# Patient Record
Sex: Female | Born: 1955 | Race: White | Hispanic: No | Marital: Married | Smoking: Never smoker
Health system: Southern US, Community
[De-identification: ages and names within clinical notes are randomized; demographics above are authoritative.]

## PROBLEM LIST (undated history)

## (undated) DIAGNOSIS — I10 Essential (primary) hypertension: Secondary | ICD-10-CM

## (undated) DIAGNOSIS — R112 Nausea with vomiting, unspecified: Secondary | ICD-10-CM

## (undated) DIAGNOSIS — M1990 Unspecified osteoarthritis, unspecified site: Secondary | ICD-10-CM

## (undated) DIAGNOSIS — K219 Gastro-esophageal reflux disease without esophagitis: Secondary | ICD-10-CM

## (undated) DIAGNOSIS — R51 Headache: Secondary | ICD-10-CM

## (undated) DIAGNOSIS — F419 Anxiety disorder, unspecified: Secondary | ICD-10-CM

## (undated) DIAGNOSIS — M797 Fibromyalgia: Secondary | ICD-10-CM

## (undated) DIAGNOSIS — I341 Nonrheumatic mitral (valve) prolapse: Secondary | ICD-10-CM

## (undated) DIAGNOSIS — T7840XA Allergy, unspecified, initial encounter: Secondary | ICD-10-CM

## (undated) DIAGNOSIS — S43439A Superior glenoid labrum lesion of unspecified shoulder, initial encounter: Secondary | ICD-10-CM

## (undated) DIAGNOSIS — Z9889 Other specified postprocedural states: Secondary | ICD-10-CM

## (undated) HISTORY — DX: Other specified postprocedural states: R11.2

## (undated) HISTORY — DX: Allergy, unspecified, initial encounter: T78.40XA

## (undated) HISTORY — PX: ABDOMINAL HYSTERECTOMY: SHX81

## (undated) HISTORY — DX: Superior glenoid labrum lesion of unspecified shoulder, initial encounter: S43.439A

## (undated) HISTORY — PX: BREAST SURGERY: SHX581

## (undated) HISTORY — PX: JOINT REPLACEMENT: SHX530

## (undated) HISTORY — DX: Nonrheumatic mitral (valve) prolapse: I34.1

## (undated) HISTORY — DX: Headache: R51

## (undated) HISTORY — PX: HIP ARTHROSCOPY: SUR88

## (undated) HISTORY — DX: Fibromyalgia: M79.7

## (undated) HISTORY — DX: Other specified postprocedural states: Z98.890

## (undated) HISTORY — DX: Essential (primary) hypertension: I10

## (undated) HISTORY — PX: VAGINAL HYSTERECTOMY: SUR661

## (undated) HISTORY — DX: Anxiety disorder, unspecified: F41.9

## (undated) HISTORY — PX: TUBAL LIGATION: SHX77

## (undated) HISTORY — DX: Gastro-esophageal reflux disease without esophagitis: K21.9

---

## 1997-10-07 ENCOUNTER — Ambulatory Visit (HOSPITAL_COMMUNITY): Admission: RE | Admit: 1997-10-07 | Discharge: 1997-10-07 | Payer: Self-pay | Admitting: *Deleted

## 1998-10-05 ENCOUNTER — Ambulatory Visit (HOSPITAL_COMMUNITY): Admission: RE | Admit: 1998-10-05 | Discharge: 1998-10-05 | Payer: Self-pay | Admitting: Gastroenterology

## 1999-03-10 ENCOUNTER — Ambulatory Visit (HOSPITAL_COMMUNITY): Admission: RE | Admit: 1999-03-10 | Discharge: 1999-03-10 | Payer: Self-pay | Admitting: *Deleted

## 1999-10-18 ENCOUNTER — Other Ambulatory Visit: Admission: RE | Admit: 1999-10-18 | Discharge: 1999-10-18 | Payer: Self-pay | Admitting: *Deleted

## 2000-09-08 ENCOUNTER — Ambulatory Visit (HOSPITAL_COMMUNITY): Admission: RE | Admit: 2000-09-08 | Discharge: 2000-09-08 | Payer: Self-pay | Admitting: Internal Medicine

## 2000-09-08 ENCOUNTER — Encounter: Payer: Self-pay | Admitting: Internal Medicine

## 2001-09-06 NOTE — Progress Notes (Signed)
CORPORATE CARE CLINICAL SUMMARY   Karen Fernandez, Karen Fernandez                     SS#:        [id number]   DOB:    29-Sep-1955                               AGE:        45   SEX:    F                                     LOCATION:  CORPOR[facility] CARE   MR#:    [id number]                                 DATE:       09/06/2001   DICTATING PHYSICIAN:  Angus Palms, D.O.   The patient is here for evaluation of back pain going on the last 3 months   on a daily basis just frustrating her since it started after moving a   house.  It is bilateral along the spine from the neck to her lower back and   it just feels tight a great deal of the time.  It can bother her sleep as   it can awaken her and her job has been made uncomfortable from it.  She has   no history of scoliosis or prior back problem.  There is no bladder or   bowel difficulty.   ALLERGIES:  Morphine.   MEDICATIONS:   1. Prevacid.   2. Paxil.   3. Estratest.   PHYSICAL EXAMINATION:   VITALS:  Blood pressure 116/78, temperature 97.2, pulse 80, respirations   20, 5'10", 154 pounds.  Last menstrual period hysterectomy in 1994.   Examination shows normal range of motion of the spine but it does produce   tightness on all different range of motions.  There is tightness and   soreness in the mid upper back paraspinal area and the lower lumbosacral   area.  Reflexes are normal.  Straight leg raise is normal.  No   radiculopathy.   ASSESSMENT:   1. Cervical strain.   2. Thoracic strain.   3. Lumbosacral strain status post 3 months.   PLAN:  Continue with rest.  Can stop antiinflammatory since it is not   helping and we will try Skelaxin muscle relaxer 1 to 2 q.6h. p.r.n. in the   evenings and see response.  Will refer to physical therapy to help   recondition and increase her flexibility due to the 11 month old problem and   we will see response.   Electronically Signed By:   Angus Palms, D.O. 03/12/2002 11:16    _____________________________________________   Angus Palms, D.O.   sp  D: 09/06/2001  T: 09/07/2001  8:53 A    [id number]

## 2001-09-11 NOTE — Progress Notes (Signed)
[facility]                       PHYSICAL THERAPY INITIAL EVALUATION   PATIENT NAME:  Karen Fernandez, Karen Fernandez   MR#:  [id number]   DATE:  09/11/2001   REFERRING PHYSICIAN:  Angus Palms, D.O.   DATABASE: The patient is a 46 year old female referred to physical therapy   by Dr. Tresa Endo with a diagnosis of cervical, thoracic, lumbosacral strain.   Evaluate and treat 2-3 x a week for 4 weeks.  Goals: decreased pain,   increased flexibility.   SUBJECTIVE:   HISTORY OF PRESENT ILLNESS: The patient reports that the muscles in her   back feel really tight and she is unable to get them to relax. She states   that the muscles feel "Uncomfortable" from her neck all the way down to   below her hips. Recently she says she has been moving a lot and has been   lifting and carrying boxes and mattresses though she does not have any pain   immediately following this activity. She states that if she stands greater   than 5-10 minutes she has pain around her waist.  She also notes that right   above her bra line and her waist is where the pain is the worst and she   occasionally has to use her knuckle to help relax areas of her back.  She   has tried performing stretches as well as abdominal exercises and has used   heat and ice, but states that nothing seems to help relax her muscles.   Some mornings she feels okay when she wakes up and other times she feels   stiff, but she also notes that the  pain gets worse as the day goes on.   She has difficulty bending over to do activities such as brush her teeth or   wash her face and she also has occasional pain while sitting if she is   sitting in a chair that does not have good back support. She does not   complain of any difficulty with driving since her car has a good lumbar   support. She notes occasional sharp pain in her upper back around her   shoulder blades, but denies any numbness or tingling at this time. She was    given Skelaxin which she takes at night and it helps her sleep. 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   DATE OF ONSET:  Approximately 2 months ago.   PAIN SCALE RATING:  At present 3/10, at worst 7/10.   MEDICATIONS: Skelaxin, Estratest and Prevacid.   ALLERGIES:  Morphine.   PRECAUTIONS:  None.   PAST MEDICAL HISTORY: Hysterectomy in 1994, patient relates a history of   sciatica on the left and sacroiliac joint dysfunction.   X-RAYS/TESTS: None.   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 She is   involved in her children's sports and also enjoys walking.   PREVIOUS PHYSICAL THERAPY: None.   PATIENT'S GOALS: Patient wants to be able to relax her back muscles, know   what to do when the pain starts and how to  prevent it.   OBJECTIVE:   RANGE OF MOTION: Lumbar spine:  Forward bending is within functional limits   with the patient exhibiting "Muscle flickering" when returning to neutral.   Backward bending is less than 25% of normal with patient reporting pain.   Side bending is 75% of normal with patient reporting contralateral pulling,   right side bending worse than the left. Rotation is limited bilaterally   though left is worse than the right and patient reports increased pain with   right rotation.   Cervical spine:  Forward bending and backward bending are both within   functional limits though patient notes pulling bilaterally with forward   bending. Side bending right is 50% of normal with patient reporting pain on   the right, left side bending is within functional limits.  Rotation right   75% of normal with patient reporting achiness and tightness on the right,   left rotation is within functional limits.    MANUAL MUSCLE TESTS: Gross strength of the bilateral upper and lower   extremities is within normal limits though patient did report some aching   in her lower back with resisted hip flexion.   PALPATION: The patient exhibited palpable and visible increase in muscle   tone of the right upper trapezius region greater than the left though both   sides exhibit significant tone.  The patient also exhibits increased   tension along the right quadratus lumborum  region greater than the left.   Her iliac crests and PSIS's are symmetrical.   POSTURE: The patient exhibits a flattened spine throughout. She exhibits a   decreased cervical lordosis and a forward head and rounded shoulders   posture.   SENSATION:  Patient denies any numbness or tingling at this time.   SPECIAL TESTS: Straight leg raise test was negative for radicular symptoms   though patient did report pulling in her lower back at approximately 80   degrees of elevation. Cervical compression and distraction was negative.   No other special tests were performed at this time.   TODAY'S TREATMENT: After the evaluation patient was instructed in stretches   to be performed for a home program.   ASSESSMENT:   Patient presents with diffuse pain along her entire spine with decreased   range of motion, increased muscle tone, poor posture with likely back   weakness and difficulty performing prolonged activities such as standing or   sitting, difficulty with all bending activities and probable poor body   mechanics.   GOALS:  To be completed in 4 weeks.   1.    The patient will be independent with home exercise program to         facilitate mobility and strengthening of her back.   2.    The patient will report pain at worst to be a 3/10.   3.    The patient will be able to sleep through the night without         medication and without awakening due to pain.   4.    The patient will tolerate standing for 20 minutes at a time without          complaints of increasing lower back pain.   5.    The patient will be able to perform her activities of daily living         such as brushing her teeth and washing her face without complaints of  increasing back pain.   REHAB POTENTIAL: Good.   BARRIERS TO ACHIEVING GOALS: None.   PLAN:   The patient will be seen 2-3 times a week for 4 weeks for modalities p.r.n.   (heat, ultrasound, electrical stimulation), manual treatment p.r.n. and   therapeutic exercise for stretching and stabilization exercises.  The   patient will be seen by a licensed physical therapist or physical therapy   assistant.   Thank you very much for this referral.   Sincerely,   Johney Maine, M.S.P.T.   sl  D:  09/11/2001  T:  09/12/2001  9:07 A   [id number]   cc:

## 2008-06-26 ENCOUNTER — Ambulatory Visit (HOSPITAL_COMMUNITY): Admission: RE | Admit: 2008-06-26 | Discharge: 2008-06-26 | Payer: Self-pay | Admitting: Orthopedic Surgery

## 2010-03-05 ENCOUNTER — Encounter: Admission: RE | Admit: 2010-03-05 | Discharge: 2010-03-05 | Payer: Self-pay | Admitting: Gastroenterology

## 2010-09-20 LAB — URINALYSIS, ROUTINE W REFLEX MICROSCOPIC
Bilirubin Urine: NEGATIVE
Glucose, UA: NEGATIVE mg/dL
Hgb urine dipstick: NEGATIVE
Ketones, ur: NEGATIVE mg/dL
Nitrite: NEGATIVE
Protein, ur: NEGATIVE mg/dL
Specific Gravity, Urine: 1.002 — ABNORMAL LOW (ref 1.005–1.030)
Urobilinogen, UA: 0.2 mg/dL (ref 0.0–1.0)
pH: 7.5 (ref 5.0–8.0)

## 2010-09-20 LAB — BASIC METABOLIC PANEL
BUN: 13 mg/dL (ref 6–23)
CO2: 31 mEq/L (ref 19–32)
Calcium: 10 mg/dL (ref 8.4–10.5)
Chloride: 98 mEq/L (ref 96–112)
Creatinine, Ser: 0.92 mg/dL (ref 0.4–1.2)
GFR calc Af Amer: >60 mL/min (ref >60)
GFR calc non Af Amer: >60 mL/min (ref >60)
Glucose, Bld: 93 mg/dL (ref 70–99)
Potassium: 4.9 mEq/L (ref 3.5–5.1)
Sodium: 140 mEq/L (ref 135–145)

## 2010-09-20 LAB — CBC
HCT: 42.3 % (ref 36.0–46.0)
Hemoglobin: 14.1 g/dL (ref 12.0–15.0)
MCV: 95.6 fL (ref 78.0–100.0)
RDW: 12.9 % (ref 11.5–15.5)
WBC: 6.3 10*3/uL (ref 4.0–10.5)

## 2010-09-20 LAB — APTT: aPTT: 29 seconds (ref 24–37)

## 2010-10-19 NOTE — Op Note (Signed)
Brenda Short, Brenda Short            ACCOUNT NO.:  [id number]   MEDICAL RECORD NO.:  [id number]          PATIENT TYPE:  AMB   LOCATION:  DAY                          FACILITY:  [facility]   PHYSICIAN:  Ollen Gross, M.D.    DATE OF BIRTH:  July 22, 1955   DATE OF PROCEDURE:  06/26/2008  DATE OF DISCHARGE:                               OPERATIVE REPORT   PREOPERATIVE DIAGNOSIS:  Left hip labral tear.   POSTOPERATIVE DIAGNOSIS:  1. Left hip labral tear.  2. Chondral defect.   PROCEDURES:  Left hip arthroscopy with labral debridement and  chondroplasty.   SURGEON:  F. Aluisio, M.D.   ASSISTANT:  Avel Peace, P.A.C.   ANESTHESIA:  General.   ESTIMATED BLOOD LOSS:  Minimal.   DRAINS:  None.   COMPLICATIONS:  None.   CONDITION:  Stable to recovery   BRIEF CLINICAL NOTE:  [name] is a 55 year old female who had an on-  the-job injury to her hip.  She has had progressively worsening hip pain  and dysfunction.  An MRI showed labral tear.  She had intra-articular  injection which did relieve her pain temporarily and then it came back.  She presents now for hip arthroscopy and debridement.   PROCEDURE IN DETAIL:  After successful administration of general  anesthetic, the patient was placed in the right lateral decubitus  position with the left side up and her left lower extremity is draped  over the well-padded leg holder and foot placed in the well-padded  traction boot.  Under fluoro guidance, we applied traction across the  hip to gain adequate distraction.  Once distracted, the leg is locked in  that position.  The thigh is then prepped and draped in the usual  sterile fashion.  A spinal needle is used to localize the anterior and  posterior peritrochanteric portals.  The needles are both passed into  the joint.  I then placed a nitinol wire and it created the posterior  portal.  The camera and cannula passed into the joint.  Once confirmed  to be an intra-articular, inflow  was initiated and arthroscopic  visualization proceeds.   There is some hypertrophic synovium around the fovea.  The acetabulum  looks great throughout the central portion and posterior.  Anteriorly,  it looks fine except there seems to be chondral blistering present at  about the 9-10 o'clock position.  There is no evidence of a full  thickness defect.  The femoral head does show some chondromalacia with  one area of a full thickness defect at the anterosuperior position.  She  has a lot of synovitis anteriorly.  There is also a labral tear at the  anterior-inferior anterior superior portion of the labrum.  It is not  detached but there is surface fraying and tear.  We then created the  anterior portal and placed a shaver to shave down the area that was torn  on the labrum.  There is a small chondral delamination and that is  debrided back to a stable base with the shaver.  This is less than 1 x 1  cm in size.  I then used the ArthroCare to seal off the labrum where we  had debrided it.  Once again, there was no detachment.  I also used the  ArthroCare to debride the hypertrophic synovium anteriorly.  I again  inspected the joint and no other tears, defects or loose bodies.  The  arthroscopic equipment was then removed anteriorly and then 20 mL of 4%  Marcaine with epi injected through the inflow cannula.  That cannula was  then removed.  Traction is taken off the joint to reduce the hip.  The  incisions are closed with interrupted 4-0 nylon and then the incisions  cleaned and dried and a bulky sterile dressing applied.  She is taken  out of the traction boot, placed supine, awakened and transported to  recovery in stable condition.      Ollen Gross, M.D.  Electronically Signed     FA/MEDQ  D:  06/26/2008  T:  06/26/2008  Job:  [id number]

## 2010-10-20 ENCOUNTER — Other Ambulatory Visit: Payer: Self-pay | Admitting: Internal Medicine

## 2010-10-20 DIAGNOSIS — Z1231 Encounter for screening mammogram for malignant neoplasm of breast: Secondary | ICD-10-CM

## 2010-10-27 ENCOUNTER — Ambulatory Visit
Admission: RE | Admit: 2010-10-27 | Discharge: 2010-10-27 | Disposition: A | Discharge status: Home / Self Care | Payer: BC Managed Care – PPO | Source: Ambulatory Visit | Attending: Internal Medicine | Admitting: Internal Medicine

## 2010-10-27 DIAGNOSIS — Z1231 Encounter for screening mammogram for malignant neoplasm of breast: Secondary | ICD-10-CM

## 2011-04-11 ENCOUNTER — Encounter (HOSPITAL_COMMUNITY): Payer: Self-pay | Admitting: Pharmacy Technician

## 2011-04-13 ENCOUNTER — Other Ambulatory Visit: Payer: Self-pay

## 2011-04-13 ENCOUNTER — Encounter (HOSPITAL_COMMUNITY): Payer: Self-pay

## 2011-04-13 ENCOUNTER — Encounter (HOSPITAL_COMMUNITY): Payer: Worker's Compensation

## 2011-04-13 ENCOUNTER — Ambulatory Visit (HOSPITAL_COMMUNITY)
Admission: RE | Admit: 2011-04-13 | Discharge: 2011-04-13 | Disposition: A | Discharge status: Home / Self Care | Payer: Worker's Compensation | Source: Ambulatory Visit | Attending: Orthopedic Surgery | Admitting: Orthopedic Surgery

## 2011-04-13 DIAGNOSIS — I10 Essential (primary) hypertension: Secondary | ICD-10-CM | POA: Insufficient documentation

## 2011-04-13 DIAGNOSIS — Z01818 Encounter for other preprocedural examination: Secondary | ICD-10-CM | POA: Insufficient documentation

## 2011-04-13 DIAGNOSIS — Z01810 Encounter for preprocedural cardiovascular examination: Secondary | ICD-10-CM | POA: Insufficient documentation

## 2011-04-13 DIAGNOSIS — R9431 Abnormal electrocardiogram [ECG] [EKG]: Secondary | ICD-10-CM | POA: Insufficient documentation

## 2011-04-13 DIAGNOSIS — Z01812 Encounter for preprocedural laboratory examination: Secondary | ICD-10-CM | POA: Insufficient documentation

## 2011-04-13 LAB — URINALYSIS, ROUTINE W REFLEX MICROSCOPIC
Bilirubin Urine: NEGATIVE
Glucose, UA: NEGATIVE mg/dL
Hgb urine dipstick: NEGATIVE
Ketones, ur: NEGATIVE mg/dL
Protein, ur: NEGATIVE mg/dL
Urobilinogen, UA: 0.2 mg/dL (ref 0.0–1.0)

## 2011-04-13 LAB — COMPREHENSIVE METABOLIC PANEL
ALT: 18 U/L (ref 0–35)
Alkaline Phosphatase: 71 U/L (ref 39–117)
BUN: 17 mg/dL (ref 6–23)
CO2: 31 mEq/L (ref 19–32)
GFR calc Af Amer: 74 mL/min — ABNORMAL LOW (ref >90)
GFR calc non Af Amer: 64 mL/min — ABNORMAL LOW (ref >90)
Glucose, Bld: 56 mg/dL — ABNORMAL LOW (ref 70–99)
Potassium: 4 mEq/L (ref 3.5–5.1)
Sodium: 138 mEq/L (ref 135–145)
Total Bilirubin: 0.6 mg/dL (ref 0.3–1.2)

## 2011-04-13 LAB — SURGICAL PCR SCREEN: MRSA, PCR: NEGATIVE

## 2011-04-13 LAB — CBC
HCT: 38.9 % (ref 36.0–46.0)
Hemoglobin: 13.2 g/dL (ref 12.0–15.0)
RBC: 4.22 MIL/uL (ref 3.87–5.11)

## 2011-04-13 LAB — APTT: aPTT: 34 seconds (ref 24–37)

## 2011-04-13 NOTE — Patient Instructions (Addendum)
20 Brenda Short  04/13/2011   Your procedure is scheduled on: 04/20/11  Report to [facility] at 8:30 AM.  Call this number if you have problems the morning of surgery: [phone]   Remember:   Do not eat food:After Midnight.  Do not drink clear liquids: After Midnight.  Take these medicines the morning of surgery with A SIP OF WATER: TENORMIN/PROTONIX/TRAMADOL/HYDROCODONE/ AND NASOCORT IF NEEDED   Do not wear jewelry, make-up or nail polish.  Do not wear lotions, powders, or perfumes. You may wear deodorant.  Do not shave 48 hours prior to surgery.  Do not bring valuables to the hospital.  Contacts, dentures or bridgework may not be worn into surgery.  Leave suitcase in the car. After surgery it may be brought to your room.  For patients admitted to the hospital, checkout time is 11:00 AM the day of discharge.   Patients discharged the day of surgery will not be allowed to drive home.  Name and phone number of your driver:   Special Instructions: Incentive Spirometry - Practice and bring it with you on the day of surgery. and CHG Shower Use Special Wash: 1/2 bottle night before surgery and 1/2 bottle morning of surgery.   Please read over the following fact sheets that you were given: MRSA Information

## 2011-04-15 ENCOUNTER — Encounter (HOSPITAL_COMMUNITY): Payer: Self-pay

## 2011-04-15 ENCOUNTER — Encounter: Payer: Self-pay | Admitting: Cardiology

## 2011-04-15 NOTE — Pre-Procedure Instructions (Signed)
EKG FAXED TO DR. Lequita Halt REVIEWED BE DR CARIGNAN - OK FOR SURG IF PT IS ASYMPTOMATIC

## 2011-04-19 ENCOUNTER — Encounter (HOSPITAL_COMMUNITY): Payer: Self-pay | Admitting: Orthopedic Surgery

## 2011-04-19 MED ORDER — BUPIVACAINE 0.25 % ON-Q PUMP SINGLE CATH 300ML
300.0000 mL | INJECTION | Status: DC
  Filled 2011-04-19: qty 300

## 2011-04-19 NOTE — H&P (Signed)
CC- Brenda Short is a 55 y.o. female who presents with left hip pain  Hip Pain: Patient complains of left hip pain. Onset of the symptoms was several years ago. Inciting event: work related injury. Current symptoms include pain which is aggravated by walking and is worse with activity. Associated symptoms: none, popping sensation. Aggravating symptoms: any weight bearing, going up and down stairs, squatting and walking. Patient's overall course: gradually worsening. Patient has had prior hip problems. Previous visits for this problem: multiple over past 2 years. Evaluation to date: plain films, which were normal and intraarticular injection which provided temporary relief.  Treatment to date: OTC analgesics, which have been not very effective and prescription analgesics, which have been somewhat effective.  Past Medical History  Diagnosis Date  . PONV (postoperative nausea and vomiting)     WITH DURAMORPH DRIP  . Hypertension   . Headache     TAKES ATENOLOL  . MVP (mitral valve prolapse)   . Heart murmur     MVP  . GERD (gastroesophageal reflux disease)   . Glenoid labral tear   . Anxiety     Past Surgical History  Procedure Date  . Abdominal hysterectomy   . Hip arthroscopy     2010 L HIP ARTHROSCOPY   . Tubal ligation     1986  . Breast surgery     DUCTS REMOVED FROM RT BREAST 2008    Prior to Admission medications   Medication Sig Start Date End Date Taking? Authorizing Provider  atenolol (TENORMIN) 25 MG tablet Take 25 mg by mouth every morning.      Historical Provider, MD  clonazePAM (KLONOPIN) 0.5 MG tablet Take 0.5 mg by mouth at bedtime.      Historical Provider, MD  docusate sodium (COLACE) 100 MG capsule Take 100 mg by mouth 2 (two) times daily as needed. CONSTIPATION      Historical Provider, MD  docusate sodium (CORRECTOL EXTRA GENTLE) 100 MG capsule Take 100 mg by mouth 2 (two) times daily as needed. CONSTIPATION     Historical Provider, MD  enalapril  (VASOTEC) 10 MG tablet Take 10 mg by mouth every morning.      Historical Provider, MD  HYDROcodone-acetaminophen (VICODIN) 5-500 MG per tablet Take 1 tablet by mouth every 6 (six) hours as needed.      Historical Provider, MD  Multiple Vitamins-Minerals (MULTIVITAMINS THER. W/MINERALS) TABS Take 1 tablet by mouth daily.     Historical Provider, MD  pantoprazole (PROTONIX) 40 MG tablet Take 40 mg by mouth daily.      Historical Provider, MD  PARoxetine (PAXIL) 20 MG tablet Take 20 mg by mouth at bedtime.      Historical Provider, MD  polyethylene glycol powder (MIRALAX) powder Take 17 g by mouth daily as needed. CONSTIPATION     Historical Provider, MD  PRESCRIPTION MEDICATION Apply 1 application topically daily. BI EST 6:4 RATIO 0.3 MG + PROGESTERONE 4%    Historical Provider, MD  PRESCRIPTION MEDICATION Place 1 application vaginally at bedtime. ESTRIOL 1 MG + TESTOSTERONE 0.25 MG PER GRAM.  PT IS TO USE EVERY NIGHT FOR 21 DAYS THEN GO TO TWO THE THREE TIMES A WEEK.    Historical Provider, MD  traMADol (ULTRAM) 50 MG tablet Take 100 mg by mouth every 6 (six) hours as needed. Maximum dose= 8 tablets per day.pain        Historical Provider, MD  triamcinolone (NASACORT) 55 MCG/ACT nasal inhaler Place 2 sprays into the nose  daily.      Historical Provider, MD    Physical Examination: General appearance - alert, well appearing, and in no distress Mental status - alert, oriented to person, place, and time Neck - supple, no significant adenopathy Chest - clear to auscultation, no wheezes, rales or rhonchi, symmetric air entry Heart - normal rate, regular rhythm, normal S1, S2, no murmurs, rubs, clicks or gallops Abdomen - soft, nontender, nondistended, no masses or organomegaly Neurological - alert, oriented, normal speech, no focal findings or movement disorder noted Musculoskeletal - abnormal exam of left hip  A left hip exam was performed. 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 Discussed procedure, risks and potential complications with patient in detail and she elects to proceed. Goals are decreased pain and improved function with a high likelihood of achieving both goals.  Gus Rankin Cozetta Seif, MD    04/19/2011, 8:01 PM

## 2011-04-20 ENCOUNTER — Ambulatory Visit (HOSPITAL_COMMUNITY): Payer: Worker's Compensation

## 2011-04-20 ENCOUNTER — Encounter (HOSPITAL_COMMUNITY): Payer: Self-pay | Admitting: Orthopedic Surgery

## 2011-04-20 ENCOUNTER — Encounter (HOSPITAL_COMMUNITY): Payer: Self-pay | Admitting: *Deleted

## 2011-04-20 ENCOUNTER — Ambulatory Visit (HOSPITAL_COMMUNITY): Payer: Worker's Compensation | Admitting: *Deleted

## 2011-04-20 ENCOUNTER — Encounter (HOSPITAL_COMMUNITY)
Admission: RE | Disposition: A | Discharge status: Home / Self Care | Payer: Self-pay | Source: Ambulatory Visit | Attending: Orthopedic Surgery

## 2011-04-20 ENCOUNTER — Ambulatory Visit (HOSPITAL_COMMUNITY)
Admission: RE | Admit: 2011-04-20 | Discharge: 2011-04-20 | Disposition: A | Discharge status: Home / Self Care | Payer: Worker's Compensation | Source: Ambulatory Visit | Attending: Orthopedic Surgery | Admitting: Orthopedic Surgery

## 2011-04-20 DIAGNOSIS — Z96649 Presence of unspecified artificial hip joint: Secondary | ICD-10-CM | POA: Insufficient documentation

## 2011-04-20 DIAGNOSIS — I10 Essential (primary) hypertension: Secondary | ICD-10-CM | POA: Insufficient documentation

## 2011-04-20 DIAGNOSIS — F411 Generalized anxiety disorder: Secondary | ICD-10-CM | POA: Insufficient documentation

## 2011-04-20 DIAGNOSIS — M24159 Other articular cartilage disorders, unspecified hip: Secondary | ICD-10-CM | POA: Insufficient documentation

## 2011-04-20 DIAGNOSIS — I059 Rheumatic mitral valve disease, unspecified: Secondary | ICD-10-CM | POA: Insufficient documentation

## 2011-04-20 DIAGNOSIS — K219 Gastro-esophageal reflux disease without esophagitis: Secondary | ICD-10-CM | POA: Insufficient documentation

## 2011-04-20 DIAGNOSIS — R51 Headache: Secondary | ICD-10-CM | POA: Insufficient documentation

## 2011-04-20 DIAGNOSIS — Z79899 Other long term (current) drug therapy: Secondary | ICD-10-CM | POA: Insufficient documentation

## 2011-04-20 DIAGNOSIS — M9420 Chondromalacia, unspecified site: Secondary | ICD-10-CM | POA: Insufficient documentation

## 2011-04-20 DIAGNOSIS — M25559 Pain in unspecified hip: Secondary | ICD-10-CM | POA: Insufficient documentation

## 2011-04-20 DIAGNOSIS — M25552 Pain in left hip: Secondary | ICD-10-CM

## 2011-04-20 HISTORY — PX: HIP ARTHROSCOPY: SHX668

## 2011-04-20 SURGERY — ARTHROSCOPY HIP
Anesthesia: General | Site: Hip | Laterality: Left | Wound class: Clean

## 2011-04-20 MED ORDER — ONDANSETRON HCL 4 MG/2ML IJ SOLN
INTRAMUSCULAR | Status: DC | PRN
  Administered 2011-04-20 (×2): 2 mg via INTRAVENOUS

## 2011-04-20 MED ORDER — BUPIVACAINE-EPINEPHRINE PF 0.25-1:200000 % IJ SOLN
INTRAMUSCULAR | Status: AC
  Filled 2011-04-20: qty 30

## 2011-04-20 MED ORDER — METHOCARBAMOL 500 MG PO TABS: 500.0000 mg | ORAL_TABLET | Freq: Four times a day (QID) | ORAL | Status: DC | PRN

## 2011-04-20 MED ORDER — FENTANYL CITRATE 0.05 MG/ML IJ SOLN
INTRAMUSCULAR | Status: DC | PRN
  Administered 2011-04-20 (×5): 50 ug via INTRAVENOUS

## 2011-04-20 MED ORDER — SODIUM CHLORIDE 0.9 % IJ SOLN
INTRAMUSCULAR | Status: DC | PRN
  Administered 2011-04-20: 30 mL

## 2011-04-20 MED ORDER — GLYCOPYRROLATE 0.2 MG/ML IJ SOLN
INTRAMUSCULAR | Status: DC | PRN
  Administered 2011-04-20: 0.2 mg via INTRAVENOUS
  Administered 2011-04-20: .2 mg via INTRAVENOUS

## 2011-04-20 MED ORDER — PROPOFOL 10 MG/ML IV EMUL
INTRAVENOUS | Status: DC | PRN
  Administered 2011-04-20: 200 mg via INTRAVENOUS

## 2011-04-20 MED ORDER — OXYCODONE-ACETAMINOPHEN 5-325 MG PO TABS
1.0000 | ORAL_TABLET | ORAL | Status: DC | PRN
  Administered 2011-04-20: 1 via ORAL

## 2011-04-20 MED ORDER — OXYCODONE-ACETAMINOPHEN 5-325 MG PO TABS: 1.0000 | ORAL_TABLET | ORAL | Status: AC | PRN

## 2011-04-20 MED ORDER — LIDOCAINE HCL (CARDIAC) 20 MG/ML IV SOLN
INTRAVENOUS | Status: DC | PRN
  Administered 2011-04-20: 80 mg via INTRAVENOUS

## 2011-04-20 MED ORDER — METHOCARBAMOL 500 MG PO TABS: 500.0000 mg | ORAL_TABLET | Freq: Four times a day (QID) | ORAL | Status: AC

## 2011-04-20 MED ORDER — LACTATED RINGERS IV SOLN
INTRAVENOUS | Status: DC | PRN
  Administered 2011-04-20 (×2): via INTRAVENOUS

## 2011-04-20 MED ORDER — CEFAZOLIN SODIUM 1-5 GM-% IV SOLN
INTRAVENOUS | Status: AC
  Filled 2011-04-20: qty 50

## 2011-04-20 MED ORDER — BUPIVACAINE-EPINEPHRINE 0.25% -1:200000 IJ SOLN
INTRAMUSCULAR | Status: DC | PRN
  Administered 2011-04-20: 30 mL

## 2011-04-20 MED ORDER — PROMETHAZINE HCL 25 MG/ML IJ SOLN: 6.2500 mg | INTRAMUSCULAR | Status: DC | PRN

## 2011-04-20 MED ORDER — OXYCODONE-ACETAMINOPHEN 5-325 MG PO TABS
ORAL_TABLET | ORAL | Status: AC
  Administered 2011-04-20: 1 via ORAL
  Filled 2011-04-20: qty 1

## 2011-04-20 MED ORDER — LACTATED RINGERS IR SOLN
Status: DC | PRN
  Administered 2011-04-20: 6000 mL

## 2011-04-20 MED ORDER — FENTANYL CITRATE 0.05 MG/ML IJ SOLN: 25.0000 ug | INTRAMUSCULAR | Status: DC | PRN

## 2011-04-20 MED ORDER — NEOSTIGMINE METHYLSULFATE 1 MG/ML IJ SOLN
INTRAMUSCULAR | Status: DC | PRN
  Administered 2011-04-20: 1 mg via INTRAVENOUS

## 2011-04-20 MED ORDER — CEFAZOLIN SODIUM 1-5 GM-% IV SOLN
1.0000 g | Freq: Once | INTRAVENOUS | Status: DC
  Filled 2011-04-20: qty 50

## 2011-04-20 MED ORDER — ACETAMINOPHEN 10 MG/ML IV SOLN
INTRAVENOUS | Status: DC | PRN
  Administered 2011-04-20: 1000 mg via INTRAVENOUS

## 2011-04-20 MED ORDER — MIDAZOLAM HCL 5 MG/5ML IJ SOLN
INTRAMUSCULAR | Status: DC | PRN
  Administered 2011-04-20 (×2): 1 mg via INTRAVENOUS

## 2011-04-20 MED ORDER — DEXAMETHASONE SODIUM PHOSPHATE 10 MG/ML IJ SOLN
INTRAMUSCULAR | Status: DC | PRN
  Administered 2011-04-20: 10 mg via INTRAVENOUS

## 2011-04-20 MED ORDER — ROCURONIUM BROMIDE 100 MG/10ML IV SOLN
INTRAVENOUS | Status: DC | PRN
  Administered 2011-04-20: 35 mg via INTRAVENOUS

## 2011-04-20 MED ORDER — ACETAMINOPHEN 10 MG/ML IV SOLN
INTRAVENOUS | Status: AC
  Filled 2011-04-20: qty 100

## 2011-04-20 SURGICAL SUPPLY — 30 items
BLADE DA 4.2 (BLADE) ×2 IMPLANT
CLOTH BEACON ORANGE TIMEOUT ST (SAFETY) ×2 IMPLANT
COVER MAYO STAND STRL (DRAPES) ×2 IMPLANT
DRAPE LG THREE QUARTER DISP (DRAPES) ×2 IMPLANT
DRAPE STERI 35X30 U-POUCH (DRAPES) ×4 IMPLANT
DRAPE STERI IOBAN 125X83 (DRAPES) ×2 IMPLANT
DRSG EMULSION OIL 3X3 NADH (GAUZE/BANDAGES/DRESSINGS) ×2 IMPLANT
DRSG MEPILEX BORDER 4X8 (GAUZE/BANDAGES/DRESSINGS) ×2 IMPLANT
DRSG PAD ABDOMINAL 8X10 ST (GAUZE/BANDAGES/DRESSINGS) ×2 IMPLANT
DURAPREP 26ML APPLICATOR (WOUND CARE) ×2 IMPLANT
GLOVE BIO SURGEON STRL SZ7.5 (GLOVE) ×2 IMPLANT
GLOVE BIO SURGEON STRL SZ8 (GLOVE) ×2 IMPLANT
GLOVE BIOGEL PI IND STRL 8 (GLOVE) ×2 IMPLANT
GLOVE BIOGEL PI INDICATOR 8 (GLOVE) ×2
GOWN PREVENTION PLUS XLARGE (GOWN DISPOSABLE) ×2 IMPLANT
GOWN STRL REIN XL XLG (GOWN DISPOSABLE) ×2 IMPLANT
IV LACTATED RINGER IRRG 3000ML (IV SOLUTION) ×6
IV LR IRRIG 3000ML ARTHROMATIC (IV SOLUTION) ×3 IMPLANT
KIT HIP ARTHROSCOPY (SET/KITS/TRAYS/PACK) ×2 IMPLANT
MANIFOLD NEPTUNE II (INSTRUMENTS) ×4 IMPLANT
PACK ARTHROSCOPY WL (CUSTOM PROCEDURE TRAY) ×2 IMPLANT
PAD CAST 4YDX4 CTTN HI CHSV (CAST SUPPLIES) ×1 IMPLANT
PADDING CAST COTTON 4X4 STRL (CAST SUPPLIES) ×2
POSITIONER SURGICAL ARM (MISCELLANEOUS) ×2 IMPLANT
SET ARTHROSCOPY TUBING (MISCELLANEOUS) ×2
SET ARTHROSCOPY TUBING LN (MISCELLANEOUS) ×1 IMPLANT
SPONGE GAUZE 4X4 12PLY (GAUZE/BANDAGES/DRESSINGS) ×2 IMPLANT
SUT ETHILON 4 0 PS 2 18 (SUTURE) ×2 IMPLANT
TOWEL OR 17X26 10 PK STRL BLUE (TOWEL DISPOSABLE) ×2 IMPLANT
WAND TURBOVAC 50 DEGREE (SURGICAL WAND) ×2 IMPLANT

## 2011-04-20 NOTE — Preoperative (Signed)
Beta Blockers   Reason not to administer Beta Blockers:Not Applicable 

## 2011-04-20 NOTE — Op Note (Signed)
Brenda Short, [name]            ACCOUNT NO.:  [id number]  MEDICAL RECORD NO.:  [id number]  LOCATION:  [facility]                         FACILITY:  [facility]  PHYSICIAN:  Ollen Gross, M.D.    DATE OF BIRTH:  18-Feb-1956  DATE OF PROCEDURE: DATE OF DISCHARGE:                              OPERATIVE REPORT   PREOPERATIVE DIAGNOSIS:  Left hip labral tear.  POSTOPERATIVE DIAGNOSIS: 1. Left hip labral tear. 2. Chondral defect.  PROCEDURE:  Left hip arthroscopy with anterior and posterior labral tear and chondroplasty.  SURGEON:  Ollen Gross, M.D.  ASSISTANT:  Alexzandrew L. Perkins, P.A.C.  ANESTHESIA:  General.  ESTIMATED BLOOD LOSS:  Minimal.  DRAINS:  None.  COMPLICATION:  None.  CONDITION:  Stable to recovery.  BRIEF CLINICAL INDICATION:  Brenda Short is a 55 year old female with long history of significant pain and dysfunction of her left hip.  Exam and history suggested labral tear, recurrent in nature.  She has had extensive nonoperative management, presents now for arthroscopy and debridement.  PROCEDURE IN DETAIL:  After successful administration of general anesthetic, the patient was placed in the right lateral decubitus position with the left side up.  Lower leg was well padded.  Left lower extremity was then placed over the well-padded peroneal post laterally and then placed in the well-padded foot holder.  Under fluoroscopic guidance, traction was applied across left lower extremity until the hip was adequately distracted.  Traction was locked in this position.  The thigh was then prepped and draped in usual sterile fashion.  Spinal needles were passed through the sites marked, anterior and posterior peritrochanteric portals.  A small incision was made around the posterior needle.  Through a cannulated system, the cannula was passed into the joint.  Camera passed into the joint.  Once confirmed to be intra-articular, inflow was initiated.  The femoral head shows a  mild chondromalacia, no full-thickness defects.  The anterior aspect of the joint from about the 11 o'clock to the 7 o'clock position shows high- grade chondromalacia along the chondral-labral junction with some exposed bone.  There was a tear in the labrum going from the 9 o'clock to the 11 o'clock position.  It was not detached.  Tear was on the free edge of the labrum.  The anterior port was then corrected and labral tear debrided back to a stable base with the shaver and the ArthroCare device.  The unstable cartilage on the surface of the acetabulum was debrided back to a stable bony base with stable cartilaginous edges. The exposed bone was abraded with the shaver for an abrasion chondroplasty.  The overall size of this was about 1 x 2 cm.  We then inspected rest of the joint.  There was a tear at the 5 o'clock position in the posterior labrum.  We then switched the camera port of the anterior and working port of the posterior third.  The labrum was debrided back to stable base with a shaver and smoothed off with the ArthroCare.  There is no associated chondral defect.  Joints again inspected.  No other tears, defects, or loose bodies were noted.  The working portal equipment was removed and then 30 cc of 0.25% Marcaine with  epinephrine injected through the inflow cannula and that was removed.  Traction was removed from the joint and the incision was closed with interrupted 4-0 nylon.  Incisions were cleaned and dried. Bulky sterile dressing applied.  She was then awakened and transported to recovery in stable condition.     Ollen Gross, M.D.     FA/MEDQ  D:  04/20/2011  T:  04/20/2011  Job:  [id number]

## 2011-04-20 NOTE — Interval H&P Note (Signed)
History and Physical Interval Note:   04/20/2011   11:19 AM   Brenda Short  has presented today for surgery, with the diagnosis of Left Hip Labrial Tear  The various methods of treatment have been discussed with the patient and family. After consideration of risks, benefits and other options for treatment, the patient has consented to  Procedure(s): ARTHROSCOPY HIP as a surgical intervention .  The patients' history has been reviewed, patient examined, no change in status, stable for surgery.  I have reviewed the patients' chart and labs.  Questions were answered to the patient's satisfaction.     Loanne Drilling  MD  Pt examined. History and physical unchanged  Gus Rankin. Phat Dalton, MD    04/20/2011, 11:19 AM

## 2011-04-20 NOTE — Anesthesia Preprocedure Evaluation (Addendum)
Anesthesia Evaluation  Patient identified by MRN, date of birth, ID band Patient awake    Reviewed: Allergy & Precautions, H&P , NPO status , Patient's Chart, lab work & pertinent test results, reviewed documented beta blocker date and time   History of Anesthesia Complications (+) PONV  Airway Mallampati: II TM Distance: >3 FB Neck ROM: Full    Dental No notable dental hx.    Pulmonary neg pulmonary ROS,  clear to auscultation  Pulmonary exam normal       Cardiovascular Exercise Tolerance: Good hypertension, Pt. on medications and Pt. on home beta blockers neg cardio ROS - Valvular Problems/MurmursRegular Normal    Neuro/Psych Negative Neurological ROS  Negative Psych ROS   GI/Hepatic negative GI ROS, Neg liver ROS, GERD-  Controlled,  Endo/Other  Negative Endocrine ROS  Renal/GU negative Renal ROS  Genitourinary negative   Musculoskeletal negative musculoskeletal ROS (+)   Abdominal   Peds negative pediatric ROS (+)  Hematology negative hematology ROS (+)   Anesthesia Other Findings   Reproductive/Obstetrics negative OB ROS                           Anesthesia Physical Anesthesia Plan  ASA: II  Anesthesia Plan: General   Post-op Pain Management:    Induction: Intravenous  Airway Management Planned: Oral ETT  Additional Equipment:   Intra-op Plan:   Post-operative Plan: Extubation in OR  Informed Consent: I have reviewed the patients History and Physical, chart, labs and discussed the procedure including the risks, benefits and alternatives for the proposed anesthesia with the patient or authorized representative who has indicated his/her understanding and acceptance.   Dental advisory given  Plan Discussed with: CRNA  Anesthesia Plan Comments:         Anesthesia Quick Evaluation

## 2011-04-20 NOTE — Brief Op Note (Signed)
04/20/2011  12:30 PM  PATIENT:  Brenda Short  55 y.o. female  PRE-OPERATIVE DIAGNOSIS:  Left Hip Labrial Tear  POST-OPERATIVE DIAGNOSIS:  left hip labral tear plus chondral defect  PROCEDURE:  Procedure(s): ARTHROSCOPY Left HIP with labral debridement and chondroplasty  SURGEON:  Surgeon(s): Gus Rankin Kaylina Cahue  PHYSICIAN ASSISTANT:   ASSISTANTS: Avel Peace, PA-C   ANESTHESIA:   general  EBL:   minimal  BLOOD ADMINISTERED:none  DRAINS: none   LOCAL MEDICATIONS USED:  MARCAINE 30 CC  SPECIMEN:  No Specimen  DISPOSITION OF SPECIMEN:  N/A  COUNTS:  YES  TOURNIQUET:  * No tourniquets in log *  DICTATION: .Other Dictation: Dictation Number [id number]  PLAN OF CARE: Discharge to home after PACU  PATIENT DISPOSITION:  PACU - hemodynamically stable.   Delay start of Pharmacological VTE agent (>24hrs) due to surgical blood loss or risk of bleeding:  not applicable  Gus Rankin. Brenda Girvan, MD    04/20/2011, 12:36 PM

## 2011-04-20 NOTE — Transfer of Care (Signed)
Immediate Anesthesia Transfer of Care Note  Patient: Brenda Short  Procedure(s) Performed:  ARTHROSCOPY HIP - debridement labral tear, chondroplasty  Patient Location: PACU  Anesthesia Type: General  Level of Consciousness: awake, oriented, sedated and patient cooperative  Airway & Oxygen Therapy: Patient Spontanous Breathing and Patient connected to face mask oxygen  Post-op Assessment: Report given to PACU RN, Post -op Vital signs reviewed and stable and Patient moving all extremities X 4  Post vital signs: Reviewed and stable  Complications: No apparent anesthesia complications

## 2011-04-21 NOTE — Anesthesia Postprocedure Evaluation (Signed)
Anesthesia Post Note  Patient: Brenda Short  Procedure(s) Performed:  ARTHROSCOPY HIP - debridement labral tear, chondroplasty  Anesthesia type: General  Patient location: PACU  Post pain: Pain level controlled  Post assessment: Post-op Vital signs reviewed  Last Vitals:  Filed Vitals:   04/20/11 1547  BP: 133/85  Pulse: 72  Temp: 36.8 C  Resp: 20    Post vital signs: Reviewed  Level of consciousness: sedated  Complications: No apparent anesthesia complications

## 2011-04-22 ENCOUNTER — Encounter (HOSPITAL_COMMUNITY): Payer: Self-pay | Admitting: Orthopedic Surgery

## 2011-06-30 ENCOUNTER — Encounter: Payer: Self-pay | Admitting: Cardiology

## 2011-07-07 ENCOUNTER — Encounter: Payer: Self-pay | Admitting: Cardiology

## 2011-08-09 ENCOUNTER — Other Ambulatory Visit: Payer: Self-pay | Admitting: Orthopedic Surgery

## 2011-10-07 ENCOUNTER — Ambulatory Visit (INDEPENDENT_AMBULATORY_CARE_PROVIDER_SITE_OTHER): Payer: Worker's Compensation | Admitting: Cardiology

## 2011-10-07 ENCOUNTER — Encounter: Payer: Self-pay | Admitting: Cardiology

## 2011-10-07 VITALS — BP 137/89 | HR 72 | Ht 70.0 in | Wt 163.0 lb

## 2011-10-07 DIAGNOSIS — Z01810 Encounter for preprocedural cardiovascular examination: Secondary | ICD-10-CM

## 2011-10-07 DIAGNOSIS — I10 Essential (primary) hypertension: Secondary | ICD-10-CM

## 2011-10-07 DIAGNOSIS — I059 Rheumatic mitral valve disease, unspecified: Secondary | ICD-10-CM

## 2011-10-07 DIAGNOSIS — R079 Chest pain, unspecified: Secondary | ICD-10-CM | POA: Insufficient documentation

## 2011-10-07 DIAGNOSIS — I341 Nonrheumatic mitral (valve) prolapse: Secondary | ICD-10-CM

## 2011-10-07 NOTE — Assessment & Plan Note (Signed)
Patient has occasional chest ache with anxiety. She has limited mobility because of left hip arthralgias. Her electrocardiogram shows nonspecific changes. Plan dobutamine echocardiogram for risk stratification. If negative she may proceed with her surgery.

## 2011-10-07 NOTE — Assessment & Plan Note (Signed)
Blood pressure controlled. Continue present medications. 

## 2011-10-07 NOTE — Assessment & Plan Note (Signed)
Symptoms atypical. Dobutamine echo for risk stratification.

## 2011-10-07 NOTE — Patient Instructions (Addendum)
Your physician recommends that you schedule a follow-up appointment in: AS NEEDED PENDING TEST RESULTS  Your physician has requested that you have a dobutamine echocardiogram. For further information please visit [email or web address] Please follow instruction sheet as given.    Your physician has requested that you have an echocardiogram. Echocardiography is a painless test that uses sound waves to create images of your heart. It provides your doctor with information about the size and shape of your heart and how well your heart's chambers and valves are working. This procedure takes approximately one hour. There are no restrictions for this procedure.

## 2011-10-07 NOTE — Progress Notes (Signed)
HPI: 56 yo female for preop evaluation prior to hip replacement. Patient carries the diagnosis of mitral valve prolapse. She has significant pain in her left hip with ambulation that limits her activities. She is scheduled for hip replacement and we were asked to evaluate preoperatively. She does not have dyspnea on exertion, orthopnea, PND, pedal edema, syncope or exertional chest pain. She occasionally feels an ache in her chest with anxiety. It does not radiate. No associated symptoms. Resolves spontaneously. Occasional brief flutter and pause but no sustained palpitations.  Current Outpatient Prescriptions  Medication Sig Dispense Refill  . atenolol (TENORMIN) 25 MG tablet Take 25 mg by mouth every morning.        . Azelastine HCl (ASTEPRO NA) Place into the nose 2 (two) times daily as needed.      . docusate sodium (COLACE) 100 MG capsule Take 100 mg by mouth 2 (two) times daily as needed. CONSTIPATION        . enalapril (VASOTEC) 10 MG tablet Take 10 mg by mouth every morning.        Marland Kitchen HYDROcodone-acetaminophen (NORCO) 5-325 MG per tablet Take 1 tablet by mouth every 6 (six) hours as needed.      . Multiple Vitamins-Minerals (MULTIVITAMINS THER. W/MINERALS) TABS Take 1 tablet by mouth daily.       . NON FORMULARY Estrogen Patch Every 7 Days      . pantoprazole (PROTONIX) 40 MG tablet Take 40 mg by mouth daily.       Marland Kitchen PARoxetine (PAXIL) 20 MG tablet Take 30 mg by mouth at bedtime.       . polyethylene glycol powder (MIRALAX) powder Take 17 g by mouth daily as needed. CONSTIPATION       . rOPINIRole (REQUIP) 0.5 MG tablet Take 0.5 mg by mouth 2 (two) times daily.      . traMADol (ULTRAM) 50 MG tablet Take 100 mg by mouth every 6 (six) hours as needed. Maximum dose= 8 tablets per day.pain          . triamcinolone (NASACORT) 55 MCG/ACT nasal inhaler Place 2 sprays into the nose daily.          Allergies  Allergen Reactions  . Morphine And Related Nausea And Vomiting    Past Medical  History  Diagnosis Date  . PONV (postoperative nausea and vomiting)     WITH DURAMORPH DRIP  . Hypertension   . Headache     TAKES ATENOLOL  . MVP (mitral valve prolapse)   . GERD (gastroesophageal reflux disease)   . Glenoid labral tear   . Anxiety     Past Surgical History  Procedure Date  . Abdominal hysterectomy   . Hip arthroscopy     2010 L HIP ARTHROSCOPY   . Tubal ligation     1986  . Breast surgery     DUCTS REMOVED FROM RT BREAST 2008  . Hip arthroscopy 04/20/2011    Procedure: ARTHROSCOPY HIP;  Surgeon: Gus Rankin Aluisio;  Location: [facility];  Service: Orthopedics;  Laterality: Left;  debridement labral tear, chondroplasty    History   Social History  . Marital Status: Married    Spouse Name: N/A    Number of Children: 3  . Years of Education: N/A   Occupational History  .  [employer]    Nurse   Social History Main Topics  . Smoking status: Never Smoker   . Smokeless tobacco: Not on file  . Alcohol Use: 0.6 oz/week  1 Glasses of wine per week     1-2 glasses of wine per day  . Drug Use: No  . Sexually Active: Not on file   Other Topics Concern  . Not on file   Social History Narrative  . No narrative on file    Family History  Problem Relation Age of Onset  . Heart disease      No family history of    ROS: Severe left hip arthralgias but no fevers or chills, productive cough, hemoptysis, dysphasia, odynophagia, melena, hematochezia, dysuria, hematuria, rash, seizure activity, orthopnea, PND, pedal edema, claudication. Remaining systems are negative.  Physical Exam:   Blood pressure 137/89, pulse 72, height 5\' 10"  (1.778 m), weight 73.936 kg (163 lb).  General:  Well developed/well nourished in NAD Skin warm/dry Patient not depressed No peripheral clubbing Back-normal HEENT-normal/normal eyelids Neck supple/normal carotid upstroke bilaterally; no bruits; no JVD; no thyromegaly chest - CTA/ normal expansion CV - RRR/normal S1  and S2; no murmurs, rubs or gallops;  PMI nondisplaced Abdomen -NT/ND, no HSM, no mass, + bowel sounds, no bruit 2+ femoral pulses, no bruits Ext-no edema, chords, 2+ DP Neuro-grossly nonfocal  ECG sinus rhythm at a rate of 75. Left axis deviation. Nonspecific T-wave changes.

## 2011-10-07 NOTE — Assessment & Plan Note (Signed)
I do not appreciate a murmur on examination. Echocardiogram to assess mitral valve.

## 2011-10-14 ENCOUNTER — Ambulatory Visit (HOSPITAL_COMMUNITY): Payer: Worker's Compensation | Attending: Cardiovascular Disease

## 2011-10-14 ENCOUNTER — Other Ambulatory Visit: Payer: Self-pay

## 2011-10-14 DIAGNOSIS — I341 Nonrheumatic mitral (valve) prolapse: Secondary | ICD-10-CM

## 2011-10-14 DIAGNOSIS — I059 Rheumatic mitral valve disease, unspecified: Secondary | ICD-10-CM | POA: Insufficient documentation

## 2011-10-14 DIAGNOSIS — I10 Essential (primary) hypertension: Secondary | ICD-10-CM | POA: Insufficient documentation

## 2011-10-14 DIAGNOSIS — Z01810 Encounter for preprocedural cardiovascular examination: Secondary | ICD-10-CM | POA: Insufficient documentation

## 2011-10-21 ENCOUNTER — Other Ambulatory Visit (HOSPITAL_COMMUNITY): Payer: Worker's Compensation

## 2011-10-21 ENCOUNTER — Ambulatory Visit (HOSPITAL_COMMUNITY): Payer: Worker's Compensation | Attending: Internal Medicine

## 2011-10-21 ENCOUNTER — Other Ambulatory Visit (HOSPITAL_COMMUNITY): Payer: Self-pay | Admitting: *Deleted

## 2011-10-21 ENCOUNTER — Encounter: Payer: Self-pay | Admitting: Cardiology

## 2011-10-21 ENCOUNTER — Encounter (HOSPITAL_COMMUNITY): Payer: Self-pay | Admitting: Pharmacy Technician

## 2011-10-21 VITALS — BP 139/99 | Ht 70.0 in | Wt 164.0 lb

## 2011-10-21 DIAGNOSIS — I10 Essential (primary) hypertension: Secondary | ICD-10-CM | POA: Insufficient documentation

## 2011-10-21 DIAGNOSIS — R072 Precordial pain: Secondary | ICD-10-CM | POA: Insufficient documentation

## 2011-10-21 DIAGNOSIS — R002 Palpitations: Secondary | ICD-10-CM | POA: Insufficient documentation

## 2011-10-21 DIAGNOSIS — Z01810 Encounter for preprocedural cardiovascular examination: Secondary | ICD-10-CM

## 2011-10-21 MED ORDER — SODIUM CHLORIDE 0.9 % IV SOLN
30.0000 ug/kg | Freq: Once | INTRAVENOUS | Status: AC
  Administered 2011-10-21: 30 ug/kg/min via INTRAVENOUS

## 2011-10-26 ENCOUNTER — Encounter (HOSPITAL_COMMUNITY)
Admission: RE | Admit: 2011-10-26 | Discharge: 2011-10-26 | Disposition: A | Discharge status: Home / Self Care | Payer: Worker's Compensation | Source: Ambulatory Visit | Attending: Orthopedic Surgery | Admitting: Orthopedic Surgery

## 2011-10-26 ENCOUNTER — Ambulatory Visit (HOSPITAL_COMMUNITY)
Admission: RE | Admit: 2011-10-26 | Discharge: 2011-10-26 | Disposition: A | Discharge status: Home / Self Care | Payer: Worker's Compensation | Source: Ambulatory Visit | Attending: Orthopedic Surgery | Admitting: Orthopedic Surgery

## 2011-10-26 ENCOUNTER — Encounter (HOSPITAL_COMMUNITY): Payer: Self-pay

## 2011-10-26 DIAGNOSIS — M1610 Unilateral primary osteoarthritis, unspecified hip: Secondary | ICD-10-CM | POA: Insufficient documentation

## 2011-10-26 DIAGNOSIS — Z01818 Encounter for other preprocedural examination: Secondary | ICD-10-CM | POA: Insufficient documentation

## 2011-10-26 DIAGNOSIS — M169 Osteoarthritis of hip, unspecified: Secondary | ICD-10-CM | POA: Insufficient documentation

## 2011-10-26 DIAGNOSIS — Z01812 Encounter for preprocedural laboratory examination: Secondary | ICD-10-CM | POA: Insufficient documentation

## 2011-10-26 LAB — URINALYSIS, ROUTINE W REFLEX MICROSCOPIC
Glucose, UA: NEGATIVE mg/dL
Hgb urine dipstick: NEGATIVE
Specific Gravity, Urine: 1.013 (ref 1.005–1.030)

## 2011-10-26 LAB — CBC
Hemoglobin: 13.3 g/dL (ref 12.0–15.0)
MCHC: 34 g/dL (ref 30.0–36.0)
RDW: 12.5 % (ref 11.5–15.5)

## 2011-10-26 LAB — COMPREHENSIVE METABOLIC PANEL
ALT: 19 U/L (ref 0–35)
Albumin: 4 g/dL (ref 3.5–5.2)
Alkaline Phosphatase: 76 U/L (ref 39–117)
Potassium: 4.6 mEq/L (ref 3.5–5.1)
Sodium: 138 mEq/L (ref 135–145)
Total Protein: 7.1 g/dL (ref 6.0–8.3)

## 2011-10-26 LAB — SURGICAL PCR SCREEN: Staphylococcus aureus: NEGATIVE

## 2011-10-26 LAB — APTT: aPTT: 31 seconds (ref 24–37)

## 2011-10-26 NOTE — Patient Instructions (Signed)
20 Brenda Short  10/26/2011   Your procedure is scheduled on:6-5   -2013  Report to [facility] at     0845   AM .  Call this number if you have problems the morning of surgery: [phone]   Remember:   Do not eat food:After Midnight.  May have clear liquids:up to 6 Hours before arrival. Nothing after : 0500 AM  Clear liquids include soda, tea, black coffee, apple or grape juice, broth.  Take these medicines the morning of surgery with A SIP OF WATER: Atenolol, Hydrcodone, Protonix, Tramadol, Bring Astepro Nasal Spray.   Do not wear jewelry, make-up or nail polish.  Do not wear lotions, powders, or perfumes. You may wear deodorant.  Do not shave 48 hours prior to surgery.(face and neck okay, no shaving of legs)  Do not bring valuables to the hospital.  Contacts, dentures or bridgework may not be worn into surgery.  Leave suitcase in the car. After surgery it may be brought to your room.  For patients admitted to the hospital, checkout time is 11:00 AM the day of discharge.   Patients discharged the day of surgery will not be allowed to drive home.  Name and phone number of your driver: spouse  Special Instructions: CHG Shower Use Special Wash: 1/2 bottle night before surgery and 1/2 bottle morning of surgery.(avoid face and genitals)   Please read over the following fact sheets that you were given: MRSA Information, Blood Transfusion fact sheet, Incentive Spirometry Instruction.

## 2011-10-26 NOTE — Pre-Procedure Instructions (Signed)
10-26-11 Teach Janese Banks method used for preop instructions. EKG(10-07-11), CXR (04-13-11)-reports with chart. Left Hip XRay done today.

## 2011-11-08 ENCOUNTER — Other Ambulatory Visit: Payer: Self-pay | Admitting: Orthopedic Surgery

## 2011-11-08 NOTE — Progress Notes (Signed)
Called Dr Aluisio's office and left message for Dr Aluisio's PA to enter orders for patient's surgery on 11/09/11.

## 2011-11-08 NOTE — H&P (Signed)
Brenda Short  DOB: 01/30/1956 Married / Language: English / Race: White Female  Date of Admission:  11/09/2011  Chief Complaint:  Left Hip Pain  History of Present Illness The patient is a 56 year old female who comes in for a preoperative History and Physical. The patient is scheduled for a left total hip arthroplasty to be performed by Dr. Frank V. Aluisio, MD at [facility] on 11/09/2011. The patient is a 55 year old female presenting for a post-operative visit. The patient comes in today 14 weeks out from left hip arthroscopy. The patient states that she is getting worse at this time. The pain is under fair control at this time and describes their pain as moderate. They are currently on Hydrocodone and Ultram for their pain. The patient states that the pain is worsening. She has pain in her groin with activities and at rest. She is also getting buttock pain and burning pain radiating laterally into her thigh. It is limiting her activities. She can never get fully comfortable. Unfortunately, she is getting worse instead of stabilizing or improving. She is ready to proceed with surgery. They have been treated conservatively in the past for the above stated problem and despite conservative measures, they continue to have progressive pain and severe functional limitations and dysfunction. They have failed non-operative management. It is felt that they would benefit from undergoing total joint replacement. Risks and benefits of the procedure have been discussed with the patient and they elect to proceed with surgery. There are no active contraindications to surgery such as ongoing infection or rapidly progressive neurological disease.  Allergies Morphine Sulfate (Concentrate) *ANALGESICS - OPIOID*. Nausea, Vomiting.  Problem List/Past Medical Pain, hip (719.45) Chronic pain syndrome (338.4) Lumbago (724.2). 01/29/2009 Osteoarthrosis NOS, other spec site (715.98).  01/29/2009 Lumbago (724.2). 09/09/2010 Osteoarthrosis, local NOS, pelvis/thigh (715.35). 12/23/2010 Anxiety Disorder High blood pressure Migraine Headache Gastroesophageal Reflux Disease Mitral Valve Prolapse  Family History Cancer. mother and father Cerebrovascular Accident. father Drug / Alcohol Addiction. father Hypertension. mother  Social History Alcohol use. Occasional alcohol use. current drinker; drinks wine; 5-7 per week Children. 3 Current work status. working full time Drug/Alcohol Rehab (Currently). no Exercise. Exercises weekly; does other Illicit drug use. no Living situation. live with spouse Marital status. married Most recent primary occupation. [employer] Number of flights of stairs before winded. 1 Pain Contract. no Previously in rehab. no Tobacco use. Never smoker. never smoker  Past Surgical History Hysterectomy. Date: 1994. partial (non-cancerous) Hip Arthroscopy 2010, 2012  Review of Systems General:Not Present- Chills, Fever, Night Sweats, Appetite Loss, Fatigue, Feeling sick, Weight Gain and Weight Loss. Skin:Not Present- Itching, Rash, Skin Color Changes, Ulcer, Psoriasis and Change in Hair or Nails. HEENT:Not Present- Sensitivity to light, Nose Bleed, Visual Loss, Decreased Hearing and Ringing in the Ears. Neck:Not Present- Swollen Glands and Neck Mass. Respiratory:Not Present- Shortness of breath, Snoring, Chronic Cough and Bloody sputum. Cardiovascular:Not Present- Shortness of Breath, Chest Pain, Swelling of Extremities, Leg Cramps and Palpitations. Gastrointestinal:Not Present- Bloody Stool, Heartburn, Abdominal Pain, Vomiting, Nausea and Incontinence of Stool. Female Genitourinary:Not Present- Blood in Urine, Irregular/missing periods, Frequency, Incontinence and Nocturia. Musculoskeletal:Present- Muscle Pain, Joint Pain and Back Pain. Not Present- Muscle  Weakness, Joint Stiffness and Joint Swelling. Neurological:Not Present- Tingling, Numbness, Burning, Tremor, Headaches and Dizziness. Psychiatric:Not Present- Anxiety, Depression and Memory Loss. Endocrine:Not Present- Cold Intolerance, Heat Intolerance, Excessive hunger and Excessive Thirst. Hematology:Not Present- Abnormal Bleeding, Abnormal bruising, Anemia and   Blood Clots.   Vitals Weight: 132 lb Height: 70 in Body Surface Area: 1.72 m Body Mass Index: 18.94 kg/m Pulse: 64 (Regular) Resp.: 12 (Unlabored) BP: 136/82 (Sitting, Right Arm, Standard)   Physical Exam The physical exam findings are as follows:   General Mental Status - Alert, cooperative and good historian. General Appearance- pleasant. Not in acute distress. Orientation- Oriented X3. Build & Nutrition- Well nourished and Well developed.   Head and Neck Head- normocephalic, atraumatic . Neck Global Assessment- supple. no bruit auscultated on the right and no bruit auscultated on the left.   Eye Pupil- Bilateral- Regular and Round. Motion- Bilateral- EOMI. wears glasses  Chest and Lung Exam Auscultation: Breath sounds:- clear at anterior chest wall and - clear at posterior chest wall. Adventitious sounds:- No Adventitious sounds.   Cardiovascular Auscultation:Rhythm- Regular rate and rhythm. Heart Sounds- S1 WNL and S2 WNL. Murmurs & Other Heart Sounds:Auscultation of the heart reveals - No Murmurs.   Abdomen Palpation/Percussion:Tenderness- Abdomen is non-tender to palpation. Rigidity (guarding)- Abdomen is soft. Auscultation:Auscultation of the abdomen reveals - Bowel sounds normal.   Female Genitourinary Not done, not pertinent to present illness  Musculoskeletal  Left hip, flexed to about 110 degrees, rotated in 30, out 40, abducted 40 with discomfort. This is far less motion that she has in the right hip. Gait pattern is antalgic.  Assessment &  Plan Osteoarthritis Left Hip  Patient is for a Left Total Hip Replacement by Dr. Aluisio.  Plan is to go home after the surgery.  PCP - Dr. Doug Shaw Cards - Dr. Crenshaw  Signed electronically by DREW L Aundreya Souffrant, PA-C  

## 2011-11-08 NOTE — Progress Notes (Signed)
Preoperative surgical orders have been place into the Epic hospital system for Brenda Short on 11/08/2011, 12:34 PM  by Keny Donald for surgery on 11/09/2011.  Preop Total Hip orders including Experel Injecion, IV Tylenol, and IV Decadron as long as there are no contraindications to the above medications.  These orders were originally entered on 08/09/2011 but due to a default 90 day rule, the orders were deleted automatically by the EPIC system requiring them to be reentered again.  Brenda Lavergne Hiltunen, PA-C  

## 2011-11-09 ENCOUNTER — Ambulatory Visit (HOSPITAL_COMMUNITY): Payer: Worker's Compensation | Admitting: Anesthesiology

## 2011-11-09 ENCOUNTER — Encounter (HOSPITAL_COMMUNITY): Payer: Self-pay | Admitting: *Deleted

## 2011-11-09 ENCOUNTER — Inpatient Hospital Stay (HOSPITAL_COMMUNITY): Payer: Worker's Compensation

## 2011-11-09 ENCOUNTER — Inpatient Hospital Stay (HOSPITAL_COMMUNITY)
Admission: RE | Admit: 2011-11-09 | Discharge: 2011-11-12 | DRG: 470 | Disposition: A | Discharge status: Home Health | Payer: Worker's Compensation | Source: Ambulatory Visit | Attending: Orthopedic Surgery | Admitting: Orthopedic Surgery

## 2011-11-09 ENCOUNTER — Encounter (HOSPITAL_COMMUNITY): Payer: Self-pay | Admitting: Anesthesiology

## 2011-11-09 ENCOUNTER — Encounter (HOSPITAL_COMMUNITY)
Admission: RE | Disposition: A | Discharge status: Home Health | Payer: Self-pay | Source: Ambulatory Visit | Attending: Orthopedic Surgery

## 2011-11-09 DIAGNOSIS — M1610 Unilateral primary osteoarthritis, unspecified hip: Principal | ICD-10-CM | POA: Diagnosis present

## 2011-11-09 DIAGNOSIS — E871 Hypo-osmolality and hyponatremia: Secondary | ICD-10-CM | POA: Diagnosis not present

## 2011-11-09 DIAGNOSIS — M169 Osteoarthritis of hip, unspecified: Secondary | ICD-10-CM | POA: Diagnosis present

## 2011-11-09 DIAGNOSIS — I10 Essential (primary) hypertension: Secondary | ICD-10-CM | POA: Diagnosis present

## 2011-11-09 DIAGNOSIS — K219 Gastro-esophageal reflux disease without esophagitis: Secondary | ICD-10-CM | POA: Diagnosis present

## 2011-11-09 DIAGNOSIS — E876 Hypokalemia: Secondary | ICD-10-CM | POA: Diagnosis not present

## 2011-11-09 DIAGNOSIS — Z96649 Presence of unspecified artificial hip joint: Secondary | ICD-10-CM

## 2011-11-09 DIAGNOSIS — F411 Generalized anxiety disorder: Secondary | ICD-10-CM | POA: Diagnosis present

## 2011-11-09 HISTORY — PX: TOTAL HIP ARTHROPLASTY: SHX124

## 2011-11-09 LAB — TYPE AND SCREEN: Antibody Screen: NEGATIVE

## 2011-11-09 SURGERY — ARTHROPLASTY, HIP, TOTAL,POSTERIOR APPROACH
Anesthesia: General | Site: Hip | Laterality: Left | Wound class: Clean

## 2011-11-09 MED ORDER — MENTHOL 3 MG MT LOZG: 1.0000 | LOZENGE | OROMUCOSAL | Status: DC | PRN

## 2011-11-09 MED ORDER — DEXAMETHASONE SODIUM PHOSPHATE 10 MG/ML IJ SOLN: 10.0000 mg | Freq: Once | INTRAMUSCULAR | Status: DC

## 2011-11-09 MED ORDER — LACTATED RINGERS IV SOLN
INTRAVENOUS | Status: DC
  Administered 2011-11-09: 1000 mL via INTRAVENOUS
  Administered 2011-11-09: 12:00:00 via INTRAVENOUS

## 2011-11-09 MED ORDER — PHENOL 1.4 % MT LIQD: 1.0000 | OROMUCOSAL | Status: DC | PRN

## 2011-11-09 MED ORDER — SODIUM CHLORIDE 0.9 % IV SOLN: INTRAVENOUS | Status: DC

## 2011-11-09 MED ORDER — TRAMADOL HCL 50 MG PO TABS: 100.0000 mg | ORAL_TABLET | Freq: Four times a day (QID) | ORAL | Status: DC | PRN

## 2011-11-09 MED ORDER — CHLORHEXIDINE GLUCONATE 4 % EX LIQD
60.0000 mL | Freq: Once | CUTANEOUS | Status: DC
  Filled 2011-11-09: qty 60

## 2011-11-09 MED ORDER — RIVAROXABAN 10 MG PO TABS
10.0000 mg | ORAL_TABLET | Freq: Every day | ORAL | Status: DC
  Administered 2011-11-10 – 2011-11-12 (×3): 10 mg via ORAL
  Filled 2011-11-09 (×4): qty 1

## 2011-11-09 MED ORDER — FENTANYL CITRATE 0.05 MG/ML IJ SOLN
INTRAMUSCULAR | Status: DC | PRN
  Administered 2011-11-09 (×2): 50 ug via INTRAVENOUS
  Administered 2011-11-09: 100 ug via INTRAVENOUS
  Administered 2011-11-09 (×3): 50 ug via INTRAVENOUS

## 2011-11-09 MED ORDER — ONDANSETRON HCL 4 MG PO TABS: 4.0000 mg | ORAL_TABLET | Freq: Four times a day (QID) | ORAL | Status: DC | PRN

## 2011-11-09 MED ORDER — NEOSTIGMINE METHYLSULFATE 1 MG/ML IJ SOLN
INTRAMUSCULAR | Status: DC | PRN
  Administered 2011-11-09: 4 mg via INTRAVENOUS

## 2011-11-09 MED ORDER — EPHEDRINE SULFATE 50 MG/ML IJ SOLN
INTRAMUSCULAR | Status: DC | PRN
  Administered 2011-11-09 (×2): 5 mg via INTRAVENOUS

## 2011-11-09 MED ORDER — ATENOLOL 25 MG PO TABS
25.0000 mg | ORAL_TABLET | Freq: Every day | ORAL | Status: DC
  Administered 2011-11-12: 25 mg via ORAL
  Filled 2011-11-09 (×3): qty 1

## 2011-11-09 MED ORDER — ROCURONIUM BROMIDE 100 MG/10ML IV SOLN
INTRAVENOUS | Status: DC | PRN
  Administered 2011-11-09: 40 mg via INTRAVENOUS

## 2011-11-09 MED ORDER — ACETAMINOPHEN 325 MG PO TABS
650.0000 mg | ORAL_TABLET | Freq: Four times a day (QID) | ORAL | Status: DC | PRN
  Administered 2011-11-10 – 2011-11-12 (×4): 650 mg via ORAL
  Filled 2011-11-09 (×4): qty 2

## 2011-11-09 MED ORDER — CEFAZOLIN SODIUM 1-5 GM-% IV SOLN
1.0000 g | Freq: Four times a day (QID) | INTRAVENOUS | Status: AC
  Administered 2011-11-09 – 2011-11-10 (×3): 1 g via INTRAVENOUS
  Filled 2011-11-09 (×5): qty 50

## 2011-11-09 MED ORDER — BUPIVACAINE LIPOSOME 1.3 % IJ SUSP
20.0000 mL | Freq: Once | INTRAMUSCULAR | Status: DC
  Filled 2011-11-09: qty 20

## 2011-11-09 MED ORDER — LIDOCAINE HCL (CARDIAC) 20 MG/ML IV SOLN
INTRAVENOUS | Status: DC | PRN
  Administered 2011-11-09: 60 mg via INTRAVENOUS

## 2011-11-09 MED ORDER — METHOCARBAMOL 500 MG PO TABS
500.0000 mg | ORAL_TABLET | Freq: Four times a day (QID) | ORAL | Status: DC | PRN
  Administered 2011-11-09 – 2011-11-12 (×7): 500 mg via ORAL
  Filled 2011-11-09 (×8): qty 1

## 2011-11-09 MED ORDER — ONDANSETRON HCL 4 MG/2ML IJ SOLN
INTRAMUSCULAR | Status: DC | PRN
  Administered 2011-11-09: 4 mg via INTRAVENOUS

## 2011-11-09 MED ORDER — 0.9 % SODIUM CHLORIDE (POUR BTL) OPTIME
TOPICAL | Status: DC | PRN
  Administered 2011-11-09: 1000 mL

## 2011-11-09 MED ORDER — PAROXETINE HCL 30 MG PO TABS
30.0000 mg | ORAL_TABLET | Freq: Every day | ORAL | Status: DC
  Administered 2011-11-09 – 2011-11-11 (×3): 30 mg via ORAL
  Filled 2011-11-09 (×4): qty 1

## 2011-11-09 MED ORDER — BUPIVACAINE LIPOSOME 1.3 % IJ SUSP
INTRAMUSCULAR | Status: DC | PRN
  Administered 2011-11-09: 20 mL

## 2011-11-09 MED ORDER — ENALAPRIL MALEATE 10 MG PO TABS
10.0000 mg | ORAL_TABLET | Freq: Every day | ORAL | Status: DC
  Administered 2011-11-12: 10 mg via ORAL
  Filled 2011-11-09 (×3): qty 1

## 2011-11-09 MED ORDER — MIDAZOLAM HCL 5 MG/5ML IJ SOLN
INTRAMUSCULAR | Status: DC | PRN
  Administered 2011-11-09: 2 mg via INTRAVENOUS

## 2011-11-09 MED ORDER — BISACODYL 10 MG RE SUPP: 10.0000 mg | Freq: Every day | RECTAL | Status: DC | PRN

## 2011-11-09 MED ORDER — DOCUSATE SODIUM 100 MG PO CAPS
100.0000 mg | ORAL_CAPSULE | Freq: Two times a day (BID) | ORAL | Status: DC
  Administered 2011-11-09 – 2011-11-12 (×6): 100 mg via ORAL
  Filled 2011-11-09 (×3): qty 1

## 2011-11-09 MED ORDER — PANTOPRAZOLE SODIUM 40 MG PO TBEC
40.0000 mg | DELAYED_RELEASE_TABLET | Freq: Every day | ORAL | Status: DC
  Administered 2011-11-10 – 2011-11-12 (×3): 40 mg via ORAL
  Filled 2011-11-09 (×4): qty 1

## 2011-11-09 MED ORDER — HYDROMORPHONE HCL PF 1 MG/ML IJ SOLN
INTRAMUSCULAR | Status: AC
  Filled 2011-11-09: qty 2

## 2011-11-09 MED ORDER — CEFAZOLIN SODIUM-DEXTROSE 2-3 GM-% IV SOLR
2.0000 g | INTRAVENOUS | Status: AC
  Administered 2011-11-09: 2 g via INTRAVENOUS

## 2011-11-09 MED ORDER — METHOCARBAMOL 100 MG/ML IJ SOLN
500.0000 mg | Freq: Four times a day (QID) | INTRAVENOUS | Status: DC | PRN
  Administered 2011-11-09: 500 mg via INTRAVENOUS
  Filled 2011-11-09: qty 5

## 2011-11-09 MED ORDER — OXYCODONE HCL 5 MG PO TABS
5.0000 mg | ORAL_TABLET | ORAL | Status: DC | PRN
  Administered 2011-11-10: 10 mg via ORAL
  Administered 2011-11-10: 5 mg via ORAL
  Administered 2011-11-10: 10 mg via ORAL
  Administered 2011-11-11: 5 mg via ORAL
  Administered 2011-11-11 (×2): 10 mg via ORAL
  Administered 2011-11-11: 5 mg via ORAL
  Administered 2011-11-11: 10 mg via ORAL
  Administered 2011-11-12 (×2): 5 mg via ORAL
  Administered 2011-11-12: 10 mg via ORAL
  Filled 2011-11-09 (×3): qty 2
  Filled 2011-11-09: qty 1
  Filled 2011-11-09 (×4): qty 2
  Filled 2011-11-09: qty 1
  Filled 2011-11-09 (×2): qty 2
  Filled 2011-11-09 (×2): qty 1
  Filled 2011-11-09: qty 2

## 2011-11-09 MED ORDER — FLUTICASONE PROPIONATE 50 MCG/ACT NA SUSP: 1.0000 | Freq: Every day | NASAL | Status: DC | PRN

## 2011-11-09 MED ORDER — DEXAMETHASONE SODIUM PHOSPHATE 10 MG/ML IJ SOLN
INTRAMUSCULAR | Status: DC | PRN
  Administered 2011-11-09: 10 mg via INTRAVENOUS

## 2011-11-09 MED ORDER — DEXTROSE-NACL 5-0.9 % IV SOLN
INTRAVENOUS | Status: DC
  Administered 2011-11-09: 14:00:00 via INTRAVENOUS
  Administered 2011-11-10: 75 mL via INTRAVENOUS
  Administered 2011-11-11: 20 mL/h via INTRAVENOUS

## 2011-11-09 MED ORDER — PROMETHAZINE HCL 25 MG/ML IJ SOLN: 6.2500 mg | INTRAMUSCULAR | Status: DC | PRN

## 2011-11-09 MED ORDER — ACETAMINOPHEN 10 MG/ML IV SOLN
1000.0000 mg | Freq: Once | INTRAVENOUS | Status: AC
  Administered 2011-11-09: 1000 mg via INTRAVENOUS

## 2011-11-09 MED ORDER — TEMAZEPAM 15 MG PO CAPS: 15.0000 mg | ORAL_CAPSULE | Freq: Every evening | ORAL | Status: DC | PRN

## 2011-11-09 MED ORDER — HYDROMORPHONE HCL PF 1 MG/ML IJ SOLN
0.5000 mg | INTRAMUSCULAR | Status: DC | PRN
  Administered 2011-11-09: 1 mg via INTRAVENOUS
  Administered 2011-11-09: 0.5 mg via INTRAVENOUS
  Administered 2011-11-09 – 2011-11-10 (×2): 1 mg via INTRAVENOUS
  Administered 2011-11-10: 0.5 mg via INTRAVENOUS
  Administered 2011-11-10 (×5): 1 mg via INTRAVENOUS
  Filled 2011-11-09 (×9): qty 1

## 2011-11-09 MED ORDER — METOCLOPRAMIDE HCL 10 MG PO TABS: 5.0000 mg | ORAL_TABLET | Freq: Three times a day (TID) | ORAL | Status: DC | PRN

## 2011-11-09 MED ORDER — PROPOFOL 10 MG/ML IV EMUL
INTRAVENOUS | Status: DC | PRN
  Administered 2011-11-09: 170 mg via INTRAVENOUS

## 2011-11-09 MED ORDER — METOCLOPRAMIDE HCL 5 MG/ML IJ SOLN: 5.0000 mg | Freq: Three times a day (TID) | INTRAMUSCULAR | Status: DC | PRN

## 2011-11-09 MED ORDER — ACETAMINOPHEN 10 MG/ML IV SOLN
INTRAVENOUS | Status: AC
  Filled 2011-11-09: qty 100

## 2011-11-09 MED ORDER — GLYCOPYRROLATE 0.2 MG/ML IJ SOLN
INTRAMUSCULAR | Status: DC | PRN
  Administered 2011-11-09: 0.4 mg via INTRAVENOUS
  Administered 2011-11-09: .6 mg via INTRAVENOUS

## 2011-11-09 MED ORDER — HYDROMORPHONE HCL PF 1 MG/ML IJ SOLN
0.2500 mg | INTRAMUSCULAR | Status: DC | PRN
  Administered 2011-11-09 (×4): 0.5 mg via INTRAVENOUS

## 2011-11-09 MED ORDER — ONDANSETRON HCL 4 MG/2ML IJ SOLN: 4.0000 mg | Freq: Four times a day (QID) | INTRAMUSCULAR | Status: DC | PRN

## 2011-11-09 MED ORDER — CEFAZOLIN SODIUM-DEXTROSE 2-3 GM-% IV SOLR
INTRAVENOUS | Status: AC
  Filled 2011-11-09: qty 50

## 2011-11-09 MED ORDER — ROPINIROLE HCL 0.5 MG PO TABS
0.5000 mg | ORAL_TABLET | Freq: Two times a day (BID) | ORAL | Status: DC
  Administered 2011-11-09 – 2011-11-12 (×6): 0.5 mg via ORAL
  Filled 2011-11-09 (×7): qty 1

## 2011-11-09 MED ORDER — ACETAMINOPHEN 650 MG RE SUPP: 650.0000 mg | Freq: Four times a day (QID) | RECTAL | Status: DC | PRN

## 2011-11-09 MED ORDER — ACETAMINOPHEN 10 MG/ML IV SOLN
1000.0000 mg | Freq: Four times a day (QID) | INTRAVENOUS | Status: AC
  Administered 2011-11-09 – 2011-11-10 (×4): 1000 mg via INTRAVENOUS
  Filled 2011-11-09 (×6): qty 100

## 2011-11-09 MED ORDER — DIPHENHYDRAMINE HCL 12.5 MG/5ML PO ELIX: 12.5000 mg | ORAL_SOLUTION | ORAL | Status: DC | PRN

## 2011-11-09 MED ORDER — FLEET ENEMA 7-19 GM/118ML RE ENEM: 1.0000 | ENEMA | Freq: Once | RECTAL | Status: AC | PRN

## 2011-11-09 MED ORDER — HYDROMORPHONE HCL PF 1 MG/ML IJ SOLN
INTRAMUSCULAR | Status: AC
  Filled 2011-11-09: qty 1

## 2011-11-09 MED ORDER — AZELASTINE HCL 0.1 % NA SOLN: 1.0000 | Freq: Two times a day (BID) | NASAL | Status: DC | PRN

## 2011-11-09 MED ORDER — POLYETHYLENE GLYCOL 3350 17 G PO PACK
17.0000 g | PACK | Freq: Every day | ORAL | Status: DC | PRN
  Filled 2011-11-09: qty 1

## 2011-11-09 SURGICAL SUPPLY — 52 items
BAG SPEC THK2 15X12 ZIP CLS (MISCELLANEOUS) ×1
BAG ZIPLOCK 12X15 (MISCELLANEOUS) ×2 IMPLANT
BIT DRILL 2.8X128 (BIT) ×2 IMPLANT
BLADE EXTENDED COATED 6.5IN (ELECTRODE) ×2 IMPLANT
BLADE SAW SAG 73X25 THK (BLADE) ×1
BLADE SAW SGTL 73X25 THK (BLADE) ×1 IMPLANT
CLOTH BEACON ORANGE TIMEOUT ST (SAFETY) ×2 IMPLANT
CLSR STERI-STRIP ANTIMIC 1/2X4 (GAUZE/BANDAGES/DRESSINGS) ×2 IMPLANT
DECANTER SPIKE VIAL GLASS SM (MISCELLANEOUS) ×2 IMPLANT
DRAPE INCISE IOBAN 66X45 STRL (DRAPES) ×2 IMPLANT
DRAPE ORTHO SPLIT 77X108 STRL (DRAPES) ×4
DRAPE POUCH INSTRU U-SHP 10X18 (DRAPES) ×2 IMPLANT
DRAPE SURG ORHT 6 SPLT 77X108 (DRAPES) ×2 IMPLANT
DRAPE U-SHAPE 47X51 STRL (DRAPES) ×2 IMPLANT
DRSG ADAPTIC 3X8 NADH LF (GAUZE/BANDAGES/DRESSINGS) ×2 IMPLANT
DRSG MEPILEX BORDER 4X4 (GAUZE/BANDAGES/DRESSINGS) ×2 IMPLANT
DRSG MEPILEX BORDER 4X8 (GAUZE/BANDAGES/DRESSINGS) ×2 IMPLANT
DURAPREP 26ML APPLICATOR (WOUND CARE) ×2 IMPLANT
ELECT REM PT RETURN 9FT ADLT (ELECTROSURGICAL) ×2
ELECTRODE REM PT RTRN 9FT ADLT (ELECTROSURGICAL) ×1 IMPLANT
EVACUATOR 1/8 PVC DRAIN (DRAIN) ×2 IMPLANT
FACESHIELD LNG OPTICON STERILE (SAFETY) ×8 IMPLANT
GLOVE BIO SURGEON STRL SZ7.5 (GLOVE) ×2 IMPLANT
GLOVE BIO SURGEON STRL SZ8 (GLOVE) ×2 IMPLANT
GLOVE BIOGEL PI IND STRL 8 (GLOVE) ×2 IMPLANT
GLOVE BIOGEL PI INDICATOR 8 (GLOVE) ×2
GOWN STRL NON-REIN LRG LVL3 (GOWN DISPOSABLE) ×2 IMPLANT
GOWN STRL REIN XL XLG (GOWN DISPOSABLE) ×2 IMPLANT
IMMOBILIZER KNEE 20 (SOFTGOODS) ×2
IMMOBILIZER KNEE 20 THIGH 36 (SOFTGOODS) ×1 IMPLANT
KIT BASIN OR (CUSTOM PROCEDURE TRAY) ×2 IMPLANT
MANIFOLD NEPTUNE II (INSTRUMENTS) ×2 IMPLANT
NDL SAFETY ECLIPSE 18X1.5 (NEEDLE) ×1 IMPLANT
NEEDLE HYPO 18GX1.5 SHARP (NEEDLE) ×2
NS IRRIG 1000ML POUR BTL (IV SOLUTION) ×2 IMPLANT
PACK TOTAL JOINT (CUSTOM PROCEDURE TRAY) ×2 IMPLANT
PASSER SUT SWANSON 36MM LOOP (INSTRUMENTS) ×2 IMPLANT
POSITIONER SURGICAL ARM (MISCELLANEOUS) ×2 IMPLANT
SPONGE GAUZE 4X4 12PLY (GAUZE/BANDAGES/DRESSINGS) ×2 IMPLANT
STRIP CLOSURE SKIN 1/2X4 (GAUZE/BANDAGES/DRESSINGS) ×4 IMPLANT
SUT ETHIBOND NAB CT1 #1 30IN (SUTURE) ×4 IMPLANT
SUT MNCRL AB 4-0 PS2 18 (SUTURE) ×2 IMPLANT
SUT VIC AB 1 CT1 27 (SUTURE) ×4
SUT VIC AB 1 CT1 27XBRD ANTBC (SUTURE) ×2 IMPLANT
SUT VIC AB 2-0 CT1 27 (SUTURE) ×6
SUT VIC AB 2-0 CT1 TAPERPNT 27 (SUTURE) ×3 IMPLANT
SUT VLOC 180 0 24IN GS25 (SUTURE) ×2 IMPLANT
SYR 50ML LL SCALE MARK (SYRINGE) ×2 IMPLANT
TOWEL OR 17X26 10 PK STRL BLUE (TOWEL DISPOSABLE) ×4 IMPLANT
TOWEL OR NON WOVEN STRL DISP B (DISPOSABLE) ×2 IMPLANT
TRAY FOLEY CATH 14FRSI W/METER (CATHETERS) ×2 IMPLANT
WATER STERILE IRR 1500ML POUR (IV SOLUTION) ×2 IMPLANT

## 2011-11-09 NOTE — Anesthesia Preprocedure Evaluation (Addendum)
Anesthesia Evaluation  Patient identified by MRN, date of birth, ID band Patient awake    Reviewed: Allergy & Precautions, H&P , NPO status , Patient's Chart, lab work & pertinent test results  History of Anesthesia Complications (+) PONV  Airway Mallampati: II TM Distance: >3 FB Neck ROM: Full    Dental No notable dental hx.    Pulmonary neg pulmonary ROS,  breath sounds clear to auscultation  Pulmonary exam normal       Cardiovascular Exercise Tolerance: Good hypertension, Pt. on medications and Pt. on home beta blockers negative cardio ROS  Rhythm:Regular Rate:Normal  MVP.   Neuro/Psych  Headaches, Anxiety negative neurological ROS  negative psych ROS   GI/Hepatic negative GI ROS, Neg liver ROS, GERD-  Medicated,  Endo/Other  negative endocrine ROS  Renal/GU negative Renal ROS  negative genitourinary   Musculoskeletal negative musculoskeletal ROS (+)   Abdominal   Peds negative pediatric ROS (+)  Hematology negative hematology ROS (+)   Anesthesia Other Findings   Reproductive/Obstetrics negative OB ROS                         Anesthesia Physical Anesthesia Plan  ASA: II  Anesthesia Plan: General   Post-op Pain Management:    Induction: Intravenous  Airway Management Planned: Oral ETT  Additional Equipment:   Intra-op Plan:   Post-operative Plan: Extubation in OR  Informed Consent: I have reviewed the patients History and Physical, chart, labs and discussed the procedure including the risks, benefits and alternatives for the proposed anesthesia with the patient or authorized representative who has indicated his/her understanding and acceptance.   Dental advisory given  Plan Discussed with: CRNA  Anesthesia Plan Comments:         Anesthesia Quick Evaluation

## 2011-11-09 NOTE — Interval H&P Note (Signed)
History and Physical Interval Note:  11/09/2011 10:02 AM  Brenda Short  has presented today for surgery, with the diagnosis of osteoarthritis left hip  The various methods of treatment have been discussed with the patient and family. After consideration of risks, benefits and other options for treatment, the patient has consented to  Procedure(s) (LRB): TOTAL HIP ARTHROPLASTY (Left) as a surgical intervention .  The patients' history has been reviewed, patient examined, no change in status, stable for surgery.  I have reviewed the patients' chart and labs.  Questions were answered to the patient's satisfaction.     Loanne Drilling

## 2011-11-09 NOTE — Transfer of Care (Signed)
Immediate Anesthesia Transfer of Care Note  Patient: Brenda Short  Procedure(s) Performed: Procedure(s) (LRB): TOTAL HIP ARTHROPLASTY (Left)  Patient Location: PACU  Anesthesia Type: General  Level of Consciousness: awake, alert , oriented and patient cooperative  Airway & Oxygen Therapy: Patient Spontanous Breathing and Patient connected to face mask oxygen  Post-op Assessment: Report given to PACU RN, Post -op Vital signs reviewed and stable and Patient moving all extremities  Post vital signs: Reviewed and stable  Complications: No apparent anesthesia complications

## 2011-11-09 NOTE — Anesthesia Postprocedure Evaluation (Signed)
  Anesthesia Post-op Note  Patient: Brenda Short  Procedure(s) Performed: Procedure(s) (LRB): TOTAL HIP ARTHROPLASTY (Left)  Patient Location: PACU  Anesthesia Type: General  Level of Consciousness: awake and alert   Airway and Oxygen Therapy: Patient Spontanous Breathing  Post-op Pain: mild  Post-op Assessment: Post-op Vital signs reviewed, Patient's Cardiovascular Status Stable, Respiratory Function Stable, Patent Airway and No signs of Nausea or vomiting  Post-op Vital Signs: stable  Complications: No apparent anesthesia complications

## 2011-11-09 NOTE — H&P (View-Only) (Signed)
Brenda Short  DOB: Sep 02, 1955 Married / Language: English / Race: White Female  Date of Admission:  11/09/2011  Chief Complaint:  Left Hip Pain  History of Present Illness The patient is a 56 year old female who comes in for a preoperative History and Physical. The patient is scheduled for a left total hip arthroplasty to be performed by Dr. Gus Rankin. Aluisio, MD at [facility] on 11/09/2011. The patient is a 56 year old female presenting for a post-operative visit. The patient comes in today 14 weeks out from left hip arthroscopy. The patient states that she is getting worse at this time. The pain is under fair control at this time and describes their pain as moderate. They are currently on Hydrocodone and Ultram for their pain. The patient states that the pain is worsening. She has pain in her groin with activities and at rest. She is also getting buttock pain and burning pain radiating laterally into her thigh. It is limiting her activities. She can never get fully comfortable. Unfortunately, she is getting worse instead of stabilizing or improving. She is ready to proceed with surgery. They have been treated conservatively in the past for the above stated problem and despite conservative measures, they continue to have progressive pain and severe functional limitations and dysfunction. They have failed non-operative management. It is felt that they would benefit from undergoing total joint replacement. Risks and benefits of the procedure have been discussed with the patient and they elect to proceed with surgery. There are no active contraindications to surgery such as ongoing infection or rapidly progressive neurological disease.  Allergies Morphine Sulfate (Concentrate) *ANALGESICS - OPIOID*. Nausea, Vomiting.  Problem List/Past Medical Pain, hip (719.45) Chronic pain syndrome (338.4) Lumbago (724.2). 01/29/2009 Osteoarthrosis NOS, other spec site (715.98).  01/29/2009 Lumbago (724.2). 09/09/2010 Osteoarthrosis, local NOS, pelvis/thigh (715.35). 12/23/2010 Anxiety Disorder High blood pressure Migraine Headache Gastroesophageal Reflux Disease Mitral Valve Prolapse  Family History Cancer. mother and father Cerebrovascular Accident. father Drug / Alcohol Addiction. father Hypertension. mother  Social History Alcohol use. Occasional alcohol use. current drinker; drinks wine; 5-7 per week Children. 3 Current work status. working full time Drug/Alcohol Rehab (Currently). no Exercise. Exercises weekly; does other Illicit drug use. no Living situation. live with spouse Marital status. married Most recent primary occupation. VP [employer] Number of flights of stairs before winded. 1 Pain Contract. no Previously in rehab. no Tobacco use. Never smoker. never smoker  Past Surgical History Hysterectomy. Date: 60. partial (non-cancerous) Hip Arthroscopy 2010, 2012  Review of Systems General:Not Present- Chills, Fever, Night Sweats, Appetite Loss, Fatigue, Feeling sick, Weight Gain and Weight Loss. Skin:Not Present- Itching, Rash, Skin Color Changes, Ulcer, Psoriasis and Change in Hair or Nails. HEENT:Not Present- Sensitivity to light, Nose Bleed, Visual Loss, Decreased Hearing and Ringing in the Ears. Neck:Not Present- Swollen Glands and Neck Mass. Respiratory:Not Present- Shortness of breath, Snoring, Chronic Cough and Bloody sputum. Cardiovascular:Not Present- Shortness of Breath, Chest Pain, Swelling of Extremities, Leg Cramps and Palpitations. Gastrointestinal:Not Present- Bloody Stool, Heartburn, Abdominal Pain, Vomiting, Nausea and Incontinence of Stool. Female Genitourinary:Not Present- Blood in Urine, Irregular/missing periods, Frequency, Incontinence and Nocturia. Musculoskeletal:Present- Muscle Pain, Joint Pain and Back Pain. Not Present- Muscle  Weakness, Joint Stiffness and Joint Swelling. Neurological:Not Present- Tingling, Numbness, Burning, Tremor, Headaches and Dizziness. Psychiatric:Not Present- Anxiety, Depression and Memory Loss. Endocrine:Not Present- Cold Intolerance, Heat Intolerance, Excessive hunger and Excessive Thirst. Hematology:Not Present- Abnormal Bleeding, Abnormal bruising, Anemia and  Blood Clots.   Vitals Weight: 132 lb Height: 70 in Body Surface Area: 1.72 m Body Mass Index: 18.94 kg/m Pulse: 64 (Regular) Resp.: 12 (Unlabored) BP: 136/82 (Sitting, Right Arm, Standard)   Physical Exam The physical exam findings are as follows:   General Mental Status - Alert, cooperative and good historian. General Appearance- pleasant. Not in acute distress. Orientation- Oriented X3. Build & Nutrition- Well nourished and Well developed.   Head and Neck Head- normocephalic, atraumatic . Neck Global Assessment- supple. no bruit auscultated on the right and no bruit auscultated on the left.   Eye Pupil- Bilateral- Regular and Round. Motion- Bilateral- EOMI. wears glasses  Chest and Lung Exam Auscultation: Breath sounds:- clear at anterior chest wall and - clear at posterior chest wall. Adventitious sounds:- No Adventitious sounds.   Cardiovascular Auscultation:Rhythm- Regular rate and rhythm. Heart Sounds- S1 WNL and S2 WNL. Murmurs & Other Heart Sounds:Auscultation of the heart reveals - No Murmurs.   Abdomen Palpation/Percussion:Tenderness- Abdomen is non-tender to palpation. Rigidity (guarding)- Abdomen is soft. Auscultation:Auscultation of the abdomen reveals - Bowel sounds normal.   Female Genitourinary Not done, not pertinent to present illness  Musculoskeletal  Left hip, flexed to about 110 degrees, rotated in 30, out 40, abducted 40 with discomfort. This is far less motion that she has in the right hip. Gait pattern is antalgic.  Assessment &  Plan Osteoarthritis Left Hip  Patient is for a Left Total Hip Replacement by Dr. Lequita Halt.  Plan is to go home after the surgery.  PCP - Dr. Eric Form Cards - Dr. Jens Som  Signed electronically by Roberts Gaudy, PA-C

## 2011-11-09 NOTE — H&P (View-Only) (Signed)
Preoperative surgical orders have been place into the [facility] hospital system for [name] on 11/08/2011, 12:34 PM  by Patrica Duel for surgery on 11/09/2011.  Preop Total Hip orders including Experel Injecion, IV Tylenol, and IV Decadron as long as there are no contraindications to the above medications.  These orders were originally entered on 08/09/2011 but due to a default 90 day rule, the orders were deleted automatically by the EPIC system requiring them to be reentered again.  Avel Peace, PA-C

## 2011-11-09 NOTE — Preoperative (Signed)
Beta Blockers   Reason not to administer Beta Blockers:Atenolol this am 11-09-11 at 0600

## 2011-11-09 NOTE — Op Note (Signed)
Pre-operative diagnosis- Osteoarthritis Left hip  Post-operative diagnosis- Osteoarthritis  Left hip  Procedure-  LeftTotal Hip Arthroplasty  Surgeon- Gus Rankin. Janika Jedlicka, MD  Assistant- Avel Peace, PA-C   Anesthesia  General  EBL- 400   Drain Hemovac   Complication- None  Condition-PACU - hemodynamically stable.   Brief Clinical Note-  Brenda Short is a 56 y.o. female with end stage arthritis of her left hip with progressively worsening pain and dysfunction. Pain occurs with activity and rest including pain at night. She has tried analgesics, protected weight bearing and rest without benefit. She has had 2 prior hip arthroscopies and has had progressively worsening pain.. Radiographs demonstrate bone on bone arthritis with subchondral cyst formation. She presents now for left THA.  Procedure in detail-   The patient is brought into the operating room and placed on the operating table. After successful administration of General  anesthesia, the patient is placed in the  Right lateral decubitus position with the  Left side up and held in place with the hip positioner. The lower extremity is isolated from the perineum with plastic drapes and time-out is performed by the surgical team. The lower extremity is then prepped and draped in the usual sterile fashion. A short posterolateral incision is made with a ten blade through the subcutaneous tissue to the level of the fascia lata which is incised in line with the skin incision. The sciatic nerve is palpated and protected and the short external rotators and capsule are isolated from the femur. The hip is then dislocated and the center of the femoral head is marked. A trial prosthesis is placed such that the trial head corresponds to the center of the patients' native femoral head. The resection level is marked on the femoral neck and the resection is made with an oscillating saw. The femoral head is removed and femoral retractors placed to  gain access to the femoral canal.      The canal finder is passed into the femoral canal and the canal is thoroughly irrigated with sterile saline to remove the fatty contents. Axial reaming is performed to 15.5  mm, proximal reaming to 20D  and the sleeve machined to a large. A 20 D large trial sleeve is placed into the proximal femur.      The femur is then retracted anteriorly to gain acetabular exposure. Acetabular retractors are placed and the labrum and osteophytes are removed, Acetabular reaming is performed to 53  mm and a 54  mm Pinnacle acetabular shell is placed in anatomic position with excellent purchase. Additional dome screws were not needed.  The permanent 36 mm neutral + 4 Marathon liner is placed into the acetabular shell.      The trial femur is then placed into the femoral canal. The size is 20 x 15  stem with a 36 + 8  neck and a 36 + 0 head with the neck version 10 degrees beyond  the patients' native anteversion. The hip is reduced with excellent stability with full extension and full external rotation, 70 degrees flexion with 40 degrees adduction and 90 degrees internal rotation and 90 degrees of flexion with 70 degrees of internal rotation. The operative leg is placed on top of the non-operative leg and the leg lengths are found to be equal. The trials are then removed and the permanent implant of the same size is impacted into the femoral canal. The ceramic femoral head of the same size as the trial is placed and  the hip is reduced with the same stability parameters. The operative leg is again placed on top of the non-operative leg and the leg lengths are found to be equal.      The wound is then copiously irrigated with saline solution and the capsule and short external rotators are re-attached to the femur through drill holes with Ethibond suture. The fascia lata is closed over a hemovac drain with #1 vicryl suture and the fascia lata, gluteal muscles and subcutaneous tissues are  injected with Exparel 20ml diluted with saline 50ml. The subcutaneous tissues are closed with #1 and2-0 vicryl and the subcuticular layer closed with running 4-0 Monocryl. The drain is hooked to suction, incision cleaned and dried, and steri-srips and a bulky sterile dressing applied. The limb is placed into a knee immobilizer and the patient is awakened and transported to recovery in stable condition.      Please note that a surgical assistant was a medical necessity for this procedure in order to perform it in a safe and expeditious manner. The assistant was necessary to provide retraction to the vital neurovascular structures and to retract and position the limb to allow for anatomic placement of the prosthetic components.  Gus Rankin Ritika Hellickson, MD    11/09/2011, 11:48 AM

## 2011-11-09 NOTE — Interval H&P Note (Signed)
History and Physical Interval Note:  11/09/2011 10:01 AM  Brenda Short  has presented today for surgery, with the diagnosis of osteoarthritis left hip  The various methods of treatment have been discussed with the patient and family. After consideration of risks, benefits and other options for treatment, the patient has consented to  Procedure(s) (LRB): TOTAL HIP ARTHROPLASTY (Left) as a surgical intervention .  The patients' history has been reviewed, patient examined, no change in status, stable for surgery.  I have reviewed the patients' chart and labs.  Questions were answered to the patient's satisfaction.     Loanne Drilling

## 2011-11-10 LAB — BASIC METABOLIC PANEL
BUN: 9 mg/dL (ref 6–23)
Calcium: 8.2 mg/dL — ABNORMAL LOW (ref 8.4–10.5)
Creatinine, Ser: 0.73 mg/dL (ref 0.50–1.10)
GFR calc non Af Amer: >90 mL/min (ref >90)
Glucose, Bld: 150 mg/dL — ABNORMAL HIGH (ref 70–99)

## 2011-11-10 LAB — CBC
HCT: 28.3 % — ABNORMAL LOW (ref 36.0–46.0)
Hemoglobin: 9.5 g/dL — ABNORMAL LOW (ref 12.0–15.0)
MCH: 31.5 pg (ref 26.0–34.0)
MCHC: 33.6 g/dL (ref 30.0–36.0)

## 2011-11-10 MED ORDER — SODIUM CHLORIDE 0.9 % IV BOLUS (SEPSIS)
250.0000 mL | Freq: Once | INTRAVENOUS | Status: AC
  Administered 2011-11-10: 250 mL via INTRAVENOUS

## 2011-11-10 MED ORDER — POLYSACCHARIDE IRON COMPLEX 150 MG PO CAPS
150.0000 mg | ORAL_CAPSULE | Freq: Every day | ORAL | Status: DC
  Administered 2011-11-10 – 2011-11-12 (×3): 150 mg via ORAL
  Filled 2011-11-10 (×3): qty 1

## 2011-11-10 NOTE — Care Management Note (Signed)
    Page 1 of 2   11/11/2011     5:37:14 PM   CARE MANAGEMENT NOTE 11/11/2011  Patient:  Brenda Short, Brenda Short   Account Number:  [id number]  Date Initiated:  11/10/2011  Documentation initiated by:  Lorenda Ishihara  Subjective/Objective Assessment:   55 yo female admitted s/p hip arthroplasty. PTA lived at home with spouse. PCP is Dr. Buren Kos     Action/Plan:   Home with [facility], workers comp claim   Anticipated DC Date:  11/12/2011   Anticipated DC Plan:  HOME W HOME HEALTH SERVICES      DC Planning Services  CM consult      [facility] Choice  HOME HEALTH   Choice offered to / List presented to:  C-1 Patient   DME arranged  OTHER - SEE COMMENT      DME agency  OTHER - SEE NOTE     HH arranged  HH-2 PT      HH agency  [facility]   Status of service:  Completed, signed off Medicare Important Message given?  NO (If response is "NO", the following Medicare IM given date fields will be blank) Date Medicare IM given:   Date Additional Medicare IM given:    Discharge Disposition:    Per UR Regulation:  Reviewed for med. necessity/level of care/duration of stay  If discussed at Long Length of Stay Meetings, dates discussed:    Comments:  06007/2013 Raynelle Bring BSN CCM [phone] Received call from Worker's Comp case manager-Brenda Short-[phone]; CM will need to call Brenda Short at [phone] regarding Sendin orders for [facility] services. Brenda Short states she already perorderd DME 3n1, RW, tub seat, reacher. States [facility] will be providing HH services . TCT to Brenda Short at [facility]-requesting HH orders, op note, h&P, DME orders be sent to [phone]/confirmation received /pt will have reacher and leg lift shipped to her home. HHpt services will be provided by [facility]. Worker's comp Sports coach called patient and advised her of HH services and DMe to be delivered. Anticipate discharge 11/12/2011   11:53 tct 11-10-11 Lorenda Ishihara RN CM Workers  comp claim, case worker Brenda Short [phone]. Per patient not sure if Brenda Short was set up or [facility]. Spoke with Brenda Short from [place] who states she is following patient for d/c needs.

## 2011-11-10 NOTE — Evaluation (Signed)
Physical Therapy Evaluation Patient Details Name: Brenda Short MRN: [id number] DOB: [birth year] Today's Date: 11/10/2011 Time: 1130-1200 PT Time Calculation (min): 30 min  PT Assessment / Plan / Recommendation Clinical Impression  pt is s/p L posterior THA who was able to ambulate on eval. L leg was extremely abducted in bed. pt felt more spasms after moving leg into more adduction. RN notified for Robaxin.pt will benefit from PT to increase ROM, strength and functional mobility to DC to home.    PT Assessment  Patient needs continued PT services    Follow Up Recommendations  Home health PT;Supervision/Assistance - 24 hour    Barriers to Discharge        lEquipment Recommendations  Rolling walker with 5" wheels (pt to check on)    Recommendations for Other Services OT consult   Frequency 7X/week    Precautions / Restrictions Precautions Precautions: Posterior Hip Precaution Comments: provided handout Restrictions Weight Bearing Restrictions: No   Pertinent Vitals/Pain 5/10 for mobility. BP pre 106/69 no dizziness. Pt c/o cramping in posterior thigh to knee.+RN notified for robaxin.      Mobility  Bed Mobility Bed Mobility: Supine to Sit Supine to Sit: 3: Mod assist;HOB elevated;With rails Details for Bed Mobility Assistance: w/ use of sheet pt able to move LLE to EOB, vc for technique for body position on bed for posterior precautions. Transfers Transfers: Sit to Stand;Stand to Sit Sit to Stand: 4: Min assist;From bed;With upper extremity assist;From elevated surface Stand to Sit: To chair/3-in-1;4: Min assist;Without upper extremity assist Details for Transfer Assistance: VC on LLE position for sit/stand, hand placement., posterior precautions. Ambulation/Gait Ambulation/Gait Assistance: 1: +2 Total assist (+ 1 for following w/  chair since pt has low BP) Ambulation Distance (Feet): 50 Feet Assistive device: Rolling walker Ambulation/Gait Assistance Details:  vc  for sequence, poaition inside RW. steplength on L Gait Pattern: Step-to pattern;Decreased stance time - left    Exercises     PT Diagnosis: Difficulty walking;Acute pain  PT Problem List: Decreased strength;Decreased range of motion;Decreased activity tolerance;Decreased mobility;Decreased knowledge of use of DME;Decreased knowledge of precautions;Pain PT Treatment Interventions: DME instruction;Gait training;Stair training;Functional mobility training;Therapeutic activities;Therapeutic exercise;Patient/family education   PT Goals Acute Rehab PT Goals PT Goal Formulation: With patient Time For Goal Achievement: 11/17/11 Potential to Achieve Goals: Good Pt will go Supine/Side to Sit: with supervision PT Goal: Supine/Side to Sit - Progress: Goal set today Pt will go Sit to Supine/Side: with supervision PT Goal: Sit to Supine/Side - Progress: Goal set today Pt will go Sit to Stand: with supervision PT Goal: Sit to Stand - Progress: Goal set today Pt will go Stand to Sit: with supervision PT Goal: Stand to Sit - Progress: Goal set today Pt will Go Up / Down Stairs: Flight;with min assist;with least restrictive assistive device PT Goal: Up/Down Stairs - Progress: Goal set today Pt will Perform Home Exercise Program: with supervision, verbal cues required/provided PT Goal: Perform Home Exercise Program - Progress: Progressing toward goal Additional Goals Additional Goal #1: demo/state 3/3 posterior THA precautions. PT Goal: Additional Goal #1 - Progress: Goal set today  Visit Information  Last PT Received On: 11/10/11 Assistance Needed: +2    Subjective Data  Subjective: my leg just flopped when I got up to bathe Patient Stated Goal: I want to be able to go back to work in 8 weeks.   Prior Functioning  Home Living Lives With: Spouse Available Help at Discharge: Family;Available 24 hours/day Type of Home: House  Home Access: Stairs to enter Entrance Stairs-Number of Steps:  4 Entrance Stairs-Rails: None Home Layout: Two level;Bed/bath upstairs Alternate Level Stairs-Number of Steps: 10 +5 Alternate Level Stairs-Rails: Right Home Adaptive Equipment: Bedside commode/3-in-1;Reacher Additional Comments: pt will check for RW vs. SW availalbility Prior Function Level of Independence: Independent Able to Take Stairs?: Yes Driving: Yes Vocation: Full time employment Communication Communication: No difficulties    Cognition  Overall Cognitive Status: Appears within functional limits for tasks assessed/performed Arousal/Alertness: Awake/alert Orientation Level: Appears intact for tasks assessed Behavior During Session: [facility] for tasks performed    Extremity/Trunk Assessment Right Lower Extremity Assessment RLE ROM/Strength/Tone: Within functional levels RLE Sensation: WFL - Light Touch Left Lower Extremity Assessment LLE ROM/Strength/Tone: Deficits LLE ROM/Strength/Tone Deficits: LLE was positioned in fairly extreme abduction lying  in bed, assisted pt with sheet to slide leg into more  adduction neare to midline. pt required assistance of sheet to move RLE LLE Sensation: [facility] - Light Touch Trunk Assessment Trunk Assessment: Normal   Balance    End of Session PT - End of Session Activity Tolerance: Patient tolerated treatment well;Patient limited by pain Patient left: in chair;with call bell/phone within reach Nurse Communication: Mobility status   [name] 11/10/2011, 2:43 PM  [phone]

## 2011-11-10 NOTE — Progress Notes (Signed)
Subjective: 1 Day Post-Op Procedure(s) (LRB): TOTAL HIP ARTHROPLASTY (Left) Patient reports pain as mild.   Patient seen in rounds with Dr. Lequita Halt. Husband in room. Patient is well, and has had no acute complaints or problems We will start therapy today.  Plan is to go Home after hospital stay.  Objective: Vital signs in last 24 hours: Temp:  [97.7 F (36.5 C)-98.6 F (37 C)] 98.5 F (36.9 C) (06/06 0516) Pulse Rate:  [64-96] 86  (06/06 0516) Resp:  [8-18] 18  (06/06 0516) BP: (93-159)/(54-99) 98/62 mmHg (06/06 0516) SpO2:  [93 %-100 %] 96 % (06/06 0516) Weight:  [73.483 kg (162 lb)] 73.483 kg (162 lb) (06/05 1819)  Intake/Output from previous day:  Intake/Output Summary (Last 24 hours) at 11/10/11 0822 Last data filed at 11/10/11 0517  Gross per 24 hour  Intake   3350 ml  Output   3790 ml  Net   -440 ml    Intake/Output this shift: 1600 cc UOP since MN  Labs:  [facility] 11/10/11 0420  HGB 9.5*    Basename 11/10/11 0420  WBC 12.2*  RBC 3.02*  HCT 28.3*  PLT 237    Basename 11/10/11 0420  NA 137  K 4.1  CL 103  CO2 28  BUN 9  CREATININE 0.73  GLUCOSE 150*  CALCIUM 8.2*   No results found for this basename: LABPT:2,INR:2 in the last 72 hours  EXAM General - Patient is Alert, Appropriate and Oriented Extremity - Neurovascular intact Sensation intact distally Dorsiflexion/Plantar flexion intact Dressing - dressing C/D/I Motor Function - intact, moving foot and toes well on exam.  Hemovac pulled without difficulty.  Past Medical History  Diagnosis Date  . Hypertension   . Headache     TAKES ATENOLOL  . MVP (mitral valve prolapse)   . GERD (gastroesophageal reflux disease)   . Glenoid labral tear   . Anxiety   . PONV (postoperative nausea and vomiting)     WITH DURAMORPH DRIP/ no problems 9'13 with hip scope    Assessment/Plan: 1 Day Post-Op Procedure(s) (LRB): TOTAL HIP ARTHROPLASTY (Left) Principal Problem:  *OA (osteoarthritis) of  hip   Advance diet Up with therapy Continue foley due to strict I&O and urinary output monitoring Discharge home with home health  DVT Prophylaxis - Xarelto Weight Bearing as tolerated left Leg D/C Knee Immobilizer Hemovac Pulled Begin Therapy Hip Preacutions Keep foley until tomorrow. No vaccines.  Brenda Short 11/10/2011, 8:22 AM

## 2011-11-10 NOTE — Progress Notes (Signed)
Physical Therapy Treatment Patient Details Name: Brenda Short MRN: [id number] DOB: [birth year] Today's Date: 11/10/2011 Time: 6295-2841 PT Time Calculation (min): 16 min  PT Assessment / Plan / Recommendation Comments on Treatment Session  pt has continued to have cramping inL hip and leg. assited to bed.ice applied to L hip.    Follow Up Recommendations  Home health PT    Barriers to Discharge        Equipment Recommendations  Rolling walker with 5" wheels (pt to check on)    Recommendations for Other Services OT consult  Frequency 7X/week   Plan Discharge plan remains appropriate;Frequency remains appropriate    Precautions / Restrictions Precautions Precautions: Posterior Hip Precaution Comments: provided handout Restrictions Weight Bearing Restrictions: No   Pertinent Vitals/Pain 7/10 l hip and thigh. Ice applied. Repositioned in bed.    Mobility  Bed Mobility Bed Mobility: Sit to Supine  Sit to Supine: 3: Mod assist;HOB flat Details for Bed Mobility Assistance: use of sheet to lift leg onto bed w/ mod Assist.  VC on technique to position self  on bed , VC for precautions. Transfers Transfers: Sit to Stand;Stand to Sit Sit to Stand: 4: Min assist;From chair/3-in-1;With upper extremity assist Stand to Sit: To bed;4: Min assist;With upper extremity assist Details for Transfer Assistance: VC on LLE position for sit/stand, hand placement., posterior precautions Ambulation/Gait Ambulation/Gait Assistance: 4: Min assist Ambulation Distance (Feet): 5 Feet Assistive device: Rolling walker Ambulation/Gait Assistance Details: vc for sequence, position inside RW. steplength on L Gait Pattern: Decreased stance time - left;Step-to pattern    Exercises     PT Diagnosis: Difficulty walking;Acute pain  PT Problem List: Decreased strength;Decreased range of motion;Decreased activity tolerance;Decreased mobility;Decreased knowledge of use of DME;Decreased knowledge of  precautions;Pain PT Treatment Interventions: DME instruction;Gait training;Stair training;Functional mobility training;Therapeutic activities;Therapeutic exercise;Patient/family education   PT Goals Acute Rehab PT Goals PT Goal Formulation: With patient Time For Goal Achievement: 11/17/11 Potential to Achieve Goals: Good Pt will go Supine/Side to Sit: with supervision PT Goal: Supine/Side to Sit - Progress: Goal set today Pt will go Sit to Supine/Side: with supervision PT Goal: Sit to Supine/Side - Progress: Progressing toward goal Pt will go Sit to Stand: with supervision PT Goal: Sit to Stand - Progress: Progressing toward goal Pt will go Stand to Sit: with supervision PT Goal: Stand to Sit - Progress: Progressing toward goal Pt will Ambulate: 51 - 150 feet;with rolling walker;with supervision PT Goal: Ambulate - Progress: Goal set today Pt will Go Up / Down Stairs: Flight;with min assist;with least restrictive assistive device PT Goal: Up/Down Stairs - Progress: Goal set today Pt will Perform Home Exercise Program: with supervision, verbal cues required/provided PT Goal: Perform Home Exercise Program - Progress: Progressing toward goal Additional Goals Additional Goal #1: demo/state 3/3 posterior THA precautions. PT Goal: Additional Goal #1 - Progress: Progressing toward goal  Visit Information  Last PT Received On: 11/10/11 Assistance Needed: +2    Subjective Data  Subjective: my leg is really cramping . I need to get back to bed. Patient Stated Goal: I want to be able to go back to work in 8 weeks.   Cognition  Overall Cognitive Status: Appears within functional limits for tasks assessed/performed Arousal/Alertness: Awake/alert Orientation Level: Appears intact for tasks assessed Behavior During Session: [facility] for tasks performed    Balance     End of Session PT - End of Session Activity Tolerance: Patient limited by pain Patient left: in bed;with call bell/phone within  reach Nurse Communication: Mobility status    [name] 11/10/2011, 2:55 PM [phone]

## 2011-11-10 NOTE — Progress Notes (Signed)
UR complete 

## 2011-11-11 ENCOUNTER — Encounter (HOSPITAL_COMMUNITY): Payer: Self-pay | Admitting: Orthopedic Surgery

## 2011-11-11 DIAGNOSIS — E871 Hypo-osmolality and hyponatremia: Secondary | ICD-10-CM | POA: Diagnosis not present

## 2011-11-11 DIAGNOSIS — E876 Hypokalemia: Secondary | ICD-10-CM | POA: Diagnosis not present

## 2011-11-11 LAB — CBC
MCV: 94 fL (ref 78.0–100.0)
Platelets: 208 10*3/uL (ref 150–400)
RBC: 2.68 MIL/uL — ABNORMAL LOW (ref 3.87–5.11)
WBC: 12.8 10*3/uL — ABNORMAL HIGH (ref 4.0–10.5)

## 2011-11-11 LAB — BASIC METABOLIC PANEL
CO2: 28 mEq/L (ref 19–32)
Chloride: 98 mEq/L (ref 96–112)
GFR calc Af Amer: 90 mL/min — ABNORMAL LOW (ref >90)
Potassium: 3.4 mEq/L — ABNORMAL LOW (ref 3.5–5.1)
Sodium: 133 mEq/L — ABNORMAL LOW (ref 135–145)

## 2011-11-11 MED ORDER — OXYCODONE HCL 5 MG PO TABS: 5.0000 mg | ORAL_TABLET | ORAL | Status: AC | PRN

## 2011-11-11 MED ORDER — METHOCARBAMOL 500 MG PO TABS: 500.0000 mg | ORAL_TABLET | Freq: Four times a day (QID) | ORAL | Status: AC | PRN

## 2011-11-11 MED ORDER — POTASSIUM CHLORIDE CRYS ER 20 MEQ PO TBCR
40.0000 meq | EXTENDED_RELEASE_TABLET | Freq: Every day | ORAL | Status: AC
  Administered 2011-11-11 – 2011-11-12 (×2): 40 meq via ORAL
  Filled 2011-11-11 (×2): qty 2

## 2011-11-11 MED ORDER — POLYSACCHARIDE IRON COMPLEX 150 MG PO CAPS: 150.0000 mg | ORAL_CAPSULE | Freq: Two times a day (BID) | ORAL | Status: DC

## 2011-11-11 MED ORDER — RIVAROXABAN 10 MG PO TABS: 10.0000 mg | ORAL_TABLET | Freq: Every day | ORAL | Status: DC

## 2011-11-11 NOTE — Progress Notes (Signed)
Subjective: 2 Days Post-Op Procedure(s) (LRB): TOTAL HIP ARTHROPLASTY (Left) Patient reports pain as mild.   Patient seen in rounds with Dr. Lequita Halt. Patient is well, and has had no acute complaints or problems Plan is to go Home after hospital stay.  Objective: Vital signs in last 24 hours: Temp:  [98.8 F (37.1 C)-103 F (39.4 C)] 98.9 F (37.2 C) (06/07 0428) Pulse Rate:  [87-115] 99  (06/07 0917) Resp:  [14-19] 16  (06/07 1127) BP: (96-112)/(62-70) 112/70 mmHg (06/07 0917) SpO2:  [89 %-96 %] 94 % (06/07 0800)  Intake/Output from previous day:  Intake/Output Summary (Last 24 hours) at 11/11/11 1318 Last data filed at 11/11/11 0900  Gross per 24 hour  Intake 723.66 ml  Output   1225 ml  Net -501.34 ml    Intake/Output this shift: Total I/O In: 240 [P.O.:240] Out: 550 [Urine:550]  Labs:  [facility] 11/11/11 0350 11/10/11 0420  HGB 8.6* 9.5*    Basename 11/11/11 0350 11/10/11 0420  WBC 12.8* 12.2*  RBC 2.68* 3.02*  HCT 25.2* 28.3*  PLT 208 237    Basename 11/11/11 0350 11/10/11 0420  NA 133* 137  K 3.4* 4.1  CL 98 103  CO2 28 28  BUN 9 9  CREATININE 0.82 0.73  GLUCOSE 134* 150*  CALCIUM 7.8* 8.2*   No results found for this basename: LABPT:2,INR:2 in the last 72 hours  EXAM General - Patient is Alert, Appropriate and Oriented Extremity - Neurovascular intact Sensation intact distally Dressing/Incision - clean, dry, no drainage Motor Function - intact, moving foot and toes well on exam.   Past Medical History  Diagnosis Date  . Hypertension   . Headache     TAKES ATENOLOL  . MVP (mitral valve prolapse)   . GERD (gastroesophageal reflux disease)   . Glenoid labral tear   . Anxiety   . PONV (postoperative nausea and vomiting)     WITH DURAMORPH DRIP/ no problems 9'13 with hip scope    Assessment/Plan: 2 Days Post-Op Procedure(s) (LRB): TOTAL HIP ARTHROPLASTY (Left) Principal Problem:  *OA (osteoarthritis) of hip Active Problems:  Postop  Hyponatremia  Postop Hypokalemia   Up with therapy Plan for discharge tomorrow Discharge home with home health  DVT Prophylaxis - Xarelto Weight Bearing as tolerated left Leg   Brenda Short 11/11/2011, 1:18 PM

## 2011-11-11 NOTE — Progress Notes (Signed)
Physical Therapy Treatment Patient Details Name: Brenda Short MRN: [id number] DOB: 1955/08/06 Today's Date: 11/11/2011 Time: 4098-1191 PT Time Calculation (min): 25 min  PT Assessment / Plan / Recommendation Comments on Treatment Session  pt tolerated well. some difficulty w/ getting onto very high bed. will need a  step stool. spouse present to observe and discuss option of lower bed or step stool.plan for DC 6/8 after practices flight of steps.    Follow Up Recommendations  Home health PT    Barriers to Discharge        Equipment Recommendations  None recommended by PT    Recommendations for Other Services    Frequency 7X/week   Plan Discharge plan remains appropriate;Frequency remains appropriate    Precautions / Restrictions Precautions Precautions: Posterior Hip Precaution Comments: reviewed post. precautions w/ pt ans spouse throughout session.   Pertinent Vitals/Pain 2/10 L hip    Mobility  Bed Mobility Bed Mobility:  Sit to Supine: 3: Mod assist Details for Bed Mobility Assistance: use of leg lifter to place LLE onto bed,VC to angle body on edge, pt required Mod A to lift LLE safely onto bed. placed bed  similar to bed height at home which is very tall. Transfers Transfers: Sit to Stand;Stand to Sit Sit to Stand: From chair/3-in-1;With upper extremity assist;With armrests;4: Min guard Stand to Sit: To bed;To elevated surface;With upper extremity assist Details for Transfer Assistance: VC for precautions. Ambulation/Gait Ambulation/Gait Assistance: 4: Min guard Ambulation Distance (Feet): 15 Feet Assistive device: Rolling walker Ambulation/Gait Assistance Details: VC for sequence, L leg position, position inside RW Gait Pattern: Step-to pattern    Exercises    PT Diagnosis:    PT Problem List:   PT Treatment Interventions:     PT Goals Acute Rehab PT Goals Pt will go Supine/Side to Sit: with supervision PT Goal: Supine/Side to Sit - Progress:  Progressing toward goal Pt will go Sit to Supine/Side: with supervision PT Goal: Sit to Supine/Side - Progress: Progressing toward goal Pt will go Sit to Stand: with supervision PT Goal: Sit to Stand - Progress: Progressing toward goal Pt will go Stand to Sit: with supervision PT Goal: Stand to Sit - Progress: Progressing toward goal Pt will Ambulate: 51 - 150 feet;with supervision;with rolling walker PT Goal: Ambulate - Progress: Progressing toward goal Pt will Go Up / Down Stairs: Flight;3-5 stairs;with least restrictive assistive device PT Goal: Up/Down Stairs - Progress: Progressing toward goal Pt will Perform Home Exercise Program: with supervision, verbal cues required/provided PT Goal: Perform Home Exercise Program - Progress: Progressing toward goal Additional Goals Additional Goal #1: demo/state 3/3 posterior THA precautions  PT Goal: Additional Goal #1 - Progress: Progressing toward goal  Visit Information  Last PT Received On: 11/11/11 Assistance Needed: +1    Subjective Data  Subjective: I did not want to take the narcotic,    Cognition  Overall Cognitive Status: Appears within functional limits for tasks assessed/performed    Balance     End of Session PT - End of Session Activity Tolerance: Patient tolerated treatment well Patient left: in bed;with call bell/phone within reach;with family/visitor present Nurse Communication: Mobility status    Brenda Short 11/11/2011, 3:26 PM [phone]

## 2011-11-11 NOTE — Discharge Instructions (Signed)
Dr. Frank Aluisio Total Joint Specialist [facility] [address] [place], [place] [address] [phone]   TOTAL HIP REPLACEMENT POSTOPERATIVE DIRECTIONS    Hip Rehabilitation, Guidelines Following Surgery  The results of a hip operation are greatly improved after range of motion and muscle strengthening exercises. Follow all safety measures which are given to protect your hip. If any of these exercises cause increased pain or swelling in your joint, decrease the amount until you are comfortable again. Then slowly increase the exercises. Call your caregiver if you have problems or questions.  HOME CARE INSTRUCTIONS  Most of the following instructions are designed to prevent the dislocation of your new hip.  Remove items at home which could result in a fall. This includes throw rugs or furniture in walking pathways.  Continue medications as instructed at time of discharge.  You may have some home medications which will be placed on hold until you complete the course of blood thinner medication.  You may start showering once you are discharged home but do not submerge the incision under water. Just pat the incision dry and apply a dry gauze dressing on daily. Do not put on socks or shoes without following the instructions of your caregivers.  Sit on high chairs so your hips are not bent more than 90 degrees.  Sit on chairs with arms. Use the chair arms to help push yourself up when arising.  Keep your leg on the side of the operation out in front of you when standing up.  Arrange for the use of a toilet seat elevator so you are not sitting low.  Do not do any exercises or get in any positions that cause your toes to point in (pigeon toed).  Always sleep with a pillow between your legs. Do not lie on your side in sleep with both knees touching the bed.   Walk with walker as instructed.  You may resume a sexual relationship in one month or when given the OK by  your caregiver.  Use walker as long as suggested by your caregivers.  Begin partial weight bearing after surgery but do not advance weight bearing status until approved by your surgeon.  Avoid periods of inactivity such as sitting longer than an hour when not asleep. This helps prevent blood clots.  You may return to work once you are cleared by your surgeon.  Do not drive a car for 6 weeks or until released by your surgeon.  Do not drive while taking narcotics.  Wear elastic stockings for three weeks following surgery during the day but you may remove then at night.  Make sure you keep all of your appointments after your operation with all of your doctors and caregivers.  Change the dressing daily and reapply a dry dressing each time. Please pick up a stool softener and laxative for home use as long as you are requiring pain medications.  Continue to use ice on the hip for pain and swelling from surgery. You may notice swelling that will progress down to the foot and ankle.  This is normal after  surgery.  Elevate the leg when you are not up walking on it.   It is important for you to complete the blood thinner medication as prescribed by your doctor.  Continue to use the breathing machine which will help keep your temperature  down.  It is common for your temperature to cycle up and down following surgery, especially at night when you are   not up moving around and exerting yourself.  The breathing machine keeps your lungs expanded and your temperature down.  RANGE OF MOTION AND STRENGTHENING EXERCISES  These exercises are designed to help you keep full movement of your hip joint. Follow your caregiver's or physical therapist's instructions. Perform all exercises about fifteen times, three times per day or as directed. Exercise both hips, even if you have had only one joint replacement. These exercises can be done on a training (exercise) mat, on the floor, on a table or on a bed. Use whatever  works the best and is most comfortable for you. Use music or television while you are exercising so that the exercises are a pleasant break in your day. This will make your life better with the exercises acting as a break in routine you can look forward to.  Lying on your back, slowly slide your foot toward your buttocks, raising your knee up off the floor. Then slowly slide your foot back down until your leg is straight again.  Lying on your back spread your legs as far apart as you can without causing discomfort.  Lying on your side, raise your upper leg and foot straight up from the floor as far as is comfortable. Slowly lower the leg and repeat.  Lying on your back, tighten up the muscle in the front of your thigh (quadriceps muscles). You can do this by keeping your leg straight and trying to raise your heel off the floor. This helps strengthen the largest muscle supporting your knee.  Lying on your back, tighten up the muscles of your buttocks both with the legs straight and with the knee bent at a comfortable angle while keeping your heel on the floor.   SKILLED REHAB INSTRUCTIONS: If the patient is transferred to a skilled rehab facility following release from the hospital, a list of the current medications will be sent to the facility for the patient to continue.  When discharged from the skilled rehab facility, please have the facility set up the patient's Home Health Physical Therapy prior to being released. Also, the skilled facility will be responsible for providing the patient with their medications at time of release from the facility to include their pain medication, the muscle relaxants, and their blood thinner medication. If the patient is still at the rehab facility at time of the two week follow up appointment, the skilled rehab facility will also need to assist the patient in arranging follow up appointment in our office and any transportation needs.  MAKE SURE YOU:  Understand these  instructions.  Will watch your condition.  Will get help right away if you are not doing well or get worse.  Document Released: 12/25/2003 Document Revised: 05/12/2011 Document Reviewed: 12/12/2007  ExitCare Patient Information 2012 [employer].  Take Xarelto for two and a half more weeks, then discontinue Xarelto. Once the patient has completed the Xarelto, they may resume the 81 mg Aspirin.  

## 2011-11-11 NOTE — Progress Notes (Signed)
Physical Therapy Treatment Patient Details Name: Brenda Short MRN: [id number] DOB: 08-18-1955 Today's Date: 11/11/2011 Time: 1478-2956 PT Time Calculation (min): 29 min  PT Assessment / Plan / Recommendation Comments on Treatment Session  pt improving w/ mobility. continues w/ nausea. pt has 15 steps to bedroom. not ready to practice today.    Follow Up Recommendations  Home health PT    Barriers to Discharge        Equipment Recommendations  None recommended by PT    Recommendations for Other Services    Frequency 7X/week   Plan Discharge plan remains appropriate;Frequency remains appropriate    Precautions / Restrictions Precautions Precautions: Posterior Hip Restrictions Weight Bearing Restrictions: No   Pertinent Vitals/Pain 2/10 L hip   Mobility  Bed Mobility Bed Mobility: Supine to Sit Supine to Sit: 4: Min assist Details for Bed Mobility Assistance: use of sheet to slide leg to edge, VC fro precautions for hip L Transfers Transfers: Sit to Stand;Stand to Sit Sit to Stand: 4: Min assist;From bed;With upper extremity assist;From elevated surface Stand to Sit: To chair/3-in-1;4: Min assist;With upper extremity assist Details for Transfer Assistance: VC for post. precautions Ambulation/Gait Ambulation/Gait Assistance: 4: Min assist Ambulation Distance (Feet): 60 Feet Assistive device: Rolling walker Ambulation/Gait Assistance Details: VC for sequence, L leg position, position inside RW Gait Pattern: Step-to pattern    Exercises    PT Diagnosis:    PT Problem List:   PT Treatment Interventions:     PT Goals Acute Rehab PT Goals Pt will go Supine/Side to Sit: with supervision PT Goal: Supine/Side to Sit - Progress: Progressing toward goal Pt will go Sit to Stand: with supervision PT Goal: Sit to Stand - Progress: Progressing toward goal Pt will go Stand to Sit: with supervision PT Goal: Stand to Sit - Progress: Progressing toward goal Pt will Ambulate:  51 - 150 feet;with supervision;with rolling walker PT Goal: Ambulate - Progress: Progressing toward goal Pt will Go Up / Down Stairs: Flight;3-5 stairs;with least restrictive assistive device PT Goal: Up/Down Stairs - Progress: Progressing toward goal Pt will Perform Home Exercise Program: with supervision, verbal cues required/provided PT Goal: Perform Home Exercise Program - Progress: Progressing toward goal Additional Goals Additional Goal #1: demo/state 3/3 posterior THA precautions PT Goal: Additional Goal #1 - Progress: Progressing toward goal  Visit Information  Last PT Received On: 11/11/11 Assistance Needed: +2    Subjective Data  Subjective: I feel tight in the back of my thigh   Cognition  Overall Cognitive Status: Appears within functional limits for tasks assessed/performed Behavior During Session: [facility] for tasks performed    Balance     End of Session PT - End of Session Activity Tolerance: Patient tolerated treatment well;Patient limited by fatigue Patient left: in chair;with call bell/phone within reach;with family/visitor present Nurse Communication: Mobility status    Brenda Short 11/11/2011, 12:01 PM [phone]

## 2011-11-11 NOTE — Progress Notes (Signed)
Physical Therapy Treatment Patient Details Name: Brenda Short MRN: [id number] DOB: May 26, 1956 Today's Date: 11/11/2011 Time: 4098-1191 PT Time Calculation (min): 20 min  PT Assessment / Plan / Recommendation Comments on Treatment Session  pt tolerated ther exercise well. Hip is tight, still tends to rool inward.    Follow Up Recommendations  Home health PT    Barriers to Discharge        Equipment Recommendations  None recommended by PT    Recommendations for Other Services    Frequency 7X/week   Plan Discharge plan remains appropriate;Frequency remains appropriate    Precautions / Restrictions Precautions Precautions: Posterior Hip Restrictions Weight Bearing Restrictions: No   Pertinent Vitals/Pain 2/10    Mobility    Exercises Total Joint Exercises Quad Sets: AROM;Supine;Left Short Arc Quad: AROM;Left;Supine Heel Slides: AAROM;Left;10 reps;Supine Hip ABduction/ADduction: AAROM;Left;5 reps;Supine   PT Diagnosis:    PT Problem List:   PT Treatment Interventions:     PT Goals Acute Rehab PT Goals Pt will go Supine/Side to Sit: with supervision PT Goal: Supine/Side to Sit - Progress: Progressing toward goal Pt will go Sit to Stand: with supervision PT Goal: Sit to Stand - Progress: Progressing toward goal Pt will go Stand to Sit: with supervision PT Goal: Stand to Sit - Progress: Progressing toward goal Pt will Ambulate: 51 - 150 feet;with supervision;with rolling walker PT Goal: Ambulate - Progress: Progressing toward goal Pt will Go Up / Down Stairs: Flight;3-5 stairs;with least restrictive assistive device PT Goal: Up/Down Stairs - Progress: Progressing toward goal Pt will Perform Home Exercise Program: with supervision, verbal cues required/provided PT Goal: Perform Home Exercise Program - Progress: Progressing toward goal Additional Goals Additional Goal #1: demo/state 3/3 posterior THA precautions PT Goal: Additional Goal #1 - Progress: Progressing  toward goal  Visit Information  Last PT Received On: 11/11/11 Assistance Needed: +1    Subjective Data  Subjective: i will try to rally   Cognition  Overall Cognitive Status: Appears within functional limits for tasks assessed/performed Behavior During Session: [facility] for tasks performed    Balance     End of Session PT - End of Session Activity Tolerance: Patient tolerated treatment well Patient left: in bed Nurse Communication: Mobility status    [name] 11/11/2011, 12:04 PM [phone]

## 2011-11-11 NOTE — Evaluation (Signed)
Occupational Therapy Evaluation Patient Details Name: Brenda Short MRN: [id number] DOB: 09/14/1955 Today's Date: 11/11/2011 Time: 1478-2956 OT Time Calculation (min): 47 min  OT Assessment / Plan / Recommendation Clinical Impression  This 56 year old female was admitted for L THA.  She has posterior THPs and is WBAT.  She is appropriate for skilled OT to reinforce education for ADLs and hip precautions for safe discharge home.      OT Assessment  Patient needs continued OT Services    Follow Up Recommendations  No OT follow up    Barriers to Discharge      Equipment Recommendations  Rolling walker with 5" wheels    Recommendations for Other Services    Frequency  Min 2X/week    Precautions / Restrictions Precautions Precautions: Posterior Hip Restrictions Weight Bearing Restrictions: No       ADL  Eating/Feeding: Simulated;Independent Where Assessed - Eating/Feeding: Chair Grooming: Performed;Set up;Teeth care Where Assessed - Grooming: Supported sitting Upper Body Bathing: Performed;Set up Where Assessed - Upper Body Bathing: Unsupported sitting Lower Body Bathing: Performed;Moderate assistance Where Assessed - Lower Body Bathing: Supported sit to stand Upper Body Dressing: Performed;Minimal assistance (iv) Where Assessed - Upper Body Dressing: Unsupported sitting Lower Body Dressing: Performed;Simulated;Moderate assistance (performed right sock with AE) Where Assessed - Lower Body Dressing: Sopported sit to stand Toilet Transfer: Simulated;Minimal assistance Toilet Transfer Method:  (ambulated) Toilet Transfer Equipment:  (bed to chair:  ambulated with PT) Toileting - Clothing Manipulation and Hygiene: Simulated;Min guard Where Assessed - Toileting Clothing Manipulation and Hygiene: Standing Transfers/Ambulation Related to ADLs: min guard, min cues for step length and precautions ADL Comments: husband present.  Reviewed AE and THPs.  Pt will get reacher    OT  Diagnosis: Generalized weakness  OT Problem List: Decreased strength;Decreased activity tolerance;Decreased knowledge of use of DME or AE;Decreased knowledge of precautions;Pain OT Treatment Interventions: Self-care/ADL training;DME and/or AE instruction;Patient/family education   OT Goals Acute Rehab OT Goals OT Goal Formulation: With patient/family Time For Goal Achievement: 11/18/11 Potential to Achieve Goals: Good ADL Goals Pt Will Perform Grooming: with supervision;Standing at sink ADL Goal: Grooming - Progress: Goal set today Pt Will Perform Lower Body Bathing: with supervision;Sit to stand from chair;with adaptive equipment ADL Goal: Lower Body Bathing - Progress: Goal set today Pt Will Perform Lower Body Dressing: with supervision;with adaptive equipment;Sit to stand from chair (pants with reacher) ADL Goal: Lower Body Dressing - Progress: Goal set today Pt Will Transfer to Toilet: with supervision;Ambulation;3-in-1;Maintaining hip precautions (all aspects) ADL Goal: Toilet Transfer - Progress: Goal set today Pt Will Perform Tub/Shower Transfer: Shower transfer;with min assist;Ambulation;Shower seat with back;Maintaining hip precautions ADL Goal: Tub/Shower Transfer - Progress: Goal set today  Visit Information  Last OT Received On: 11/11/11 Assistance Needed: +1 PT/OT Co-Evaluation/Treatment: Yes    Subjective Data  Subjective: "So far, I'm feeling better than yesterday" Patient Stated Goal: none stated   Prior Functioning  Home Living Bathroom Shower/Tub: Walk-in Soil scientist Toilet: Standard Home Adaptive Equipment: Bedside commode/3-in-1;Reacher;Shower chair with back Prior Function Level of Independence: Independent Communication Communication: No difficulties Dominant Hand: Right    Cognition  Overall Cognitive Status: Appears within functional limits for tasks assessed/performed Behavior During Session: [facility] for tasks performed    Extremity/Trunk  Assessment Right Upper Extremity Assessment RUE ROM/Strength/Tone: Within functional levels Left Upper Extremity Assessment LUE ROM/Strength/Tone: Within functional levels   Mobility Bed Mobility Bed Mobility: Supine to Sit Supine to Sit: 4: Min assist (used drawsheet to assist) Transfers  Sit to Stand: 4: Min assist;From chair/3-in-1;With upper extremity assist Details for Transfer Assistance: min cues   Exercise    Balance    End of Session OT - End of Session Activity Tolerance: Patient limited by fatigue Patient left: in chair;with call bell/phone within reach;with family/visitor present   Yunus Stoklosa 11/11/2011, 9:54 AM Marica Otter, OTR/L [phone] 11/11/2011

## 2011-11-11 NOTE — Progress Notes (Deleted)
PT NOTE--Spoke w/ pt's wife re: DC plan and pt's need for 24/7 assistance/supervision. Wife spoke w/ pt. Wife plans to take pt home. Wife does acknowledge that pt has some confusion presently. She states this has occurred before and pt improved after return home. Will plan for DC to home. Rec. HHPT.

## 2011-11-12 LAB — BASIC METABOLIC PANEL WITH GFR
BUN: 6 mg/dL (ref 6–23)
CO2: 31 meq/L (ref 19–32)
Calcium: 8.8 mg/dL (ref 8.4–10.5)
Chloride: 100 meq/L (ref 96–112)
Creatinine, Ser: 0.68 mg/dL (ref 0.50–1.10)
GFR calc Af Amer: >90 mL/min
GFR calc non Af Amer: >90 mL/min
Glucose, Bld: 129 mg/dL — ABNORMAL HIGH (ref 70–99)
Potassium: 4.7 meq/L (ref 3.5–5.1)
Sodium: 137 meq/L (ref 135–145)

## 2011-11-12 LAB — CBC
HCT: 26.6 % — ABNORMAL LOW (ref 36.0–46.0)
Hemoglobin: 8.9 g/dL — ABNORMAL LOW (ref 12.0–15.0)
MCH: 31.2 pg (ref 26.0–34.0)
MCHC: 33.5 g/dL (ref 30.0–36.0)
MCV: 93.3 fL (ref 78.0–100.0)
Platelets: 225 K/uL (ref 150–400)
RBC: 2.85 MIL/uL — ABNORMAL LOW (ref 3.87–5.11)
RDW: 12.3 % (ref 11.5–15.5)
WBC: 11.1 K/uL — ABNORMAL HIGH (ref 4.0–10.5)

## 2011-11-12 NOTE — Progress Notes (Signed)
Discharged to family auto via w/c. Assessment unchanged from Am.

## 2011-11-12 NOTE — Progress Notes (Signed)
Physical Therapy Treatment Patient Details Name: Brenda Short MRN: [id number] DOB: [birth year] Today's Date: 11/12/2011 Time: 7829-5621 PT Time Calculation (min): 38 min  PT Assessment / Plan / Recommendation Comments on Treatment Session  Great progress today with increased gait distance and stair education completed. Provided pt with handouts for precautions, stairs and exercises. Pt without questions at end of session and ready for discharge home today with spouse.    Follow Up Recommendations  Home health PT       Equipment Recommendations  None recommended by OT       Frequency 7X/week   Plan Discharge plan remains appropriate;Frequency remains appropriate    Precautions / Restrictions Precautions Precautions: Posterior Hip Precaution Comments: pt able to recall and demo'd 3/3 post hip precautions throughout session. Restrictions Weight Bearing Restrictions: No Other Position/Activity Restrictions: WBAT Lt. LE       Mobility  Bed Mobility Bed Mobility: Supine to Sit;Sitting - Scoot to Edge of Bed Supine to Sit: 6: Modified independent (Device/Increase time);HOB flat (pt uses blue leg lifter to assist with lt LE movements) Sitting - Scoot to Edge of Bed: 5: Supervision Details for Bed Mobility Assistance: cues to maintain lt LE position with movements (to not cross midline and maintain precautions). Transfers Transfers: Sit to Stand;Stand to Sit Sit to Stand: 4: Min guard;With upper extremity assist;With armrests;From chair/3-in-1 Stand to Sit: 4: Min guard;With upper extremity assist;With armrests;To chair/3-in-1 Details for Transfer Assistance: Min VCs for hand placement and LE position. Ambulation/Gait Ambulation/Gait Assistance: 5: Supervision Ambulation Distance (Feet): 180 Feet Assistive device: Rolling walker Ambulation/Gait Assistance Details: cues to increase step length on right LE and for correct walker position with gait. Gait Pattern: Step-through  pattern;Decreased stride length;Antalgic Stairs: Yes Stairs Assistance: 5: Supervision Stairs Assistance Details (indicate cue type and reason): cues for correct sequency on stairs (up with strong first, down with weak first). Spouse in later in morning and educated separately on stair technique with visual demo. Stair Management Technique: One rail Left;Step to pattern;Sideways Number of Stairs: 10     Exercises Total Joint Exercises Ankle Circles/Pumps: AROM;Both;10 reps;Supine Quad Sets: AROM;Strengthening;Both;10 reps;Supine Short Arc Quad: AROM;Strengthening;Left;10 reps;Supine Heel Slides: AAROM;Strengthening;Left;10 reps;Supine Hip ABduction/ADduction: AAROM;Strengthening;Left;Supine Straight Leg Raises: AAROM;Strengthening;Left;10 reps;Supine     PT Goals Acute Rehab PT Goals PT Goal: Supine/Side to Sit - Progress: Met PT Goal: Sit to Stand - Progress: Met PT Goal: Stand to Sit - Progress: Met PT Goal: Ambulate - Progress: Met PT Goal: Up/Down Stairs - Progress: Met PT Goal: Perform Home Exercise Program - Progress: Progressing toward goal Additional Goals PT Goal: Additional Goal #1 - Progress: Met  Visit Information  Last PT Received On: 11/12/11 Assistance Needed: +1    Subjective Data  Subjective: No new complaints, agreeable to therapy today.   Cognition  Overall Cognitive Status: Appears within functional limits for tasks assessed/performed Arousal/Alertness: Awake/alert Orientation Level: Appears intact for tasks assessed Behavior During Session: [facility] for tasks performed       End of Session PT - End of Session Equipment Utilized During Treatment: Gait belt Activity Tolerance: Patient tolerated treatment well Patient left: in chair;with call bell/phone within reach;with family/visitor present Nurse Communication: Mobility status    Sallyanne Kuster 11/12/2011, 12:27 PM  Sallyanne Kuster, PTA Office- [phone]

## 2011-11-12 NOTE — Care Management (Signed)
Cm spoke with pt confirming previous dc plan. Per Workers Comp rep [name] [facility] to provide [facility] services. Per pt DME at home, facilitated via Workers Comp.    Leonie Green [phone]

## 2011-11-12 NOTE — Progress Notes (Signed)
Subjective: 3 Days Post-Op Procedure(s) (LRB): TOTAL HIP ARTHROPLASTY (Left) Patient reports pain as mild.  Feeling much better today Plan is to go Home after hospital stay.  Objective: Vital signs in last 24 hours: Temp:  [98.4 F (36.9 C)-99.4 F (37.4 C)] 98.4 F (36.9 C) (06/08 0547) Pulse Rate:  [85-102] 85  (06/08 0547) Resp:  [14-18] 16  (06/08 0547) BP: (101-133)/(67-86) 113/76 mmHg (06/08 0547) SpO2:  [90 %-98 %] 90 % (06/08 0547)  Intake/Output from previous day:  Intake/Output Summary (Last 24 hours) at 11/12/11 0823 Last data filed at 11/12/11 0548  Gross per 24 hour  Intake 1387.33 ml  Output   2035 ml  Net -647.67 ml    Intake/Output this shift:    Labs:  Basename 11/12/11 0430 11/11/11 0350 11/10/11 0420  HGB 8.9* 8.6* 9.5*    Basename 11/12/11 0430 11/11/11 0350  WBC 11.1* 12.8*  RBC 2.85* 2.68*  HCT 26.6* 25.2*  PLT 225 208    Basename 11/12/11 0430 11/11/11 0350  NA 137 133*  K 4.7 3.4*  CL 100 98  CO2 31 28  BUN 6 9  CREATININE 0.68 0.82  GLUCOSE 129* 134*  CALCIUM 8.8 7.8*   No results found for this basename: LABPT:2,INR:2 in the last 72 hours  EXAM General - Patient is Alert, Appropriate and Oriented Extremity - Neurovascular intact Incision: dressing C/D/I No cellulitis present Compartment soft Dressing/Incision - clean, dry, no drainage Motor Function - intact, moving foot and toes well on exam.   Past Medical History  Diagnosis Date  . Hypertension   . Headache     TAKES ATENOLOL  . MVP (mitral valve prolapse)   . GERD (gastroesophageal reflux disease)   . Glenoid labral tear   . Anxiety   . PONV (postoperative nausea and vomiting)     WITH DURAMORPH DRIP/ no problems 9'13 with hip scope    Assessment/Plan: 3 Days Post-Op Procedure(s) (LRB): TOTAL HIP ARTHROPLASTY (Left) Principal Problem:  *OA (osteoarthritis) of hip Active Problems:  Postop Hyponatremia  Postop Hypokalemia   Up with therapy D/C IV  fluids Discharge home with home health  DVT Prophylaxis - Xarelto WBAT left Leg  Versie Fleener V 11/12/2011, 8:23 AM

## 2011-11-12 NOTE — Progress Notes (Signed)
Occupational Therapy Treatment Patient Details Name: Brenda Short MRN: [id number] DOB: [birth year] Today's Date: 11/12/2011 Time: 1052-1100 OT Time Calculation (min): 8 min  OT Assessment / Plan / Recommendation Comments on Treatment Session Pt mobilizing well, making steady progress towards goals.    Follow Up Recommendations  No OT follow up    Barriers to Discharge       Equipment Recommendations  None recommended by OT    Recommendations for Other Services    Frequency Min 2X/week   Plan Discharge plan remains appropriate    Precautions / Restrictions Precautions Precautions: Posterior Hip Restrictions Weight Bearing Restrictions: No   Pertinent Vitals/Pain     ADL  Tub/Shower Transfer: Performed;Min guard Tub/Shower Transfer Method: Ambulating Tub/Shower Transfer Equipment: Walk in shower ADL Comments: Pt declined further education on AE stating she knew how to use it and her boyfriend would be there to help. Educated on proper technique for stepping into the shower.    OT Diagnosis:    OT Problem List:   OT Treatment Interventions:     OT Goals ADL Goals ADL Goal: Tub/Shower Transfer - Progress: Met  Visit Information  Last OT Received On: 11/12/11 Assistance Needed: +1    Subjective Data  Subjective: I already know how to use all that equipment.   Prior Functioning       Cognition  Overall Cognitive Status: Appears within functional limits for tasks assessed/performed Arousal/Alertness: Awake/alert Orientation Level: Appears intact for tasks assessed Behavior During Session: [facility] for tasks performed    Mobility Transfers Sit to Stand: 4: Min guard;With upper extremity assist;With armrests;From chair/3-in-1 Stand to Sit: 4: Min guard;With upper extremity assist;With armrests;To chair/3-in-1 Details for Transfer Assistance: Min VCs for hand placement and LE position.   Exercises    Balance    End of Session OT - End of Session Activity  Tolerance: Patient tolerated treatment well Patient left: in chair;with call bell/phone within reach   Kamelia Lampkins A OTR/L [phone] 11/12/2011, 11:37 AM

## 2011-11-18 NOTE — Discharge Summary (Signed)
Physician Discharge Summary   Patient ID: Brenda Short MRN: [id number] DOB/AGE: September 11, 1955 56 y.o.  Admit date: 11/09/2011 Discharge date: 11/12/2011  Primary Diagnosis: Osteoarthritis Left Hip   Admission Diagnoses:  Past Medical History  Diagnosis Date  . Hypertension   . Headache     TAKES ATENOLOL  . MVP (mitral valve prolapse)   . GERD (gastroesophageal reflux disease)   . Glenoid labral tear   . Anxiety   . PONV (postoperative nausea and vomiting)     WITH DURAMORPH DRIP/ no problems 9'13 with hip scope   Discharge Diagnoses:   Principal Problem:  *OA (osteoarthritis) of hip Active Problems:  Postop Hyponatremia  Postop Hypokalemia  Procedure: Procedure(s) (LRB): TOTAL HIP ARTHROPLASTY (Left)   Consults: None  HPI: Brenda Short is a 56 y.o. female with end stage arthritis of her left hip with progressively worsening pain and dysfunction. Pain occurs with activity and rest including pain at night. She has tried analgesics, protected weight bearing and rest without benefit. She has had 2 prior hip arthroscopies and has had progressively worsening pain.. Radiographs demonstrate bone on bone arthritis with subchondral cyst formation. She presents now for left THA.    Laboratory Data: Hospital Outpatient Visit on 10/26/2011  Component Date Value Range Status  . MRSA, PCR 10/26/2011 NEGATIVE  NEGATIVE Final  . Staphylococcus aureus 10/26/2011 NEGATIVE  NEGATIVE Final   Comment:                                 The Xpert SA Assay (FDA                          approved for NASAL specimens                          only), is one component of                          a comprehensive surveillance                          program.  It is not intended                          to diagnose infection nor to                          guide or monitor treatment.  Marland Kitchen aPTT 10/26/2011 31  24 - 37 seconds Final  . WBC 10/26/2011 6.3  4.0 - 10.5 K/uL Final  . RBC 10/26/2011  4.22  3.87 - 5.11 MIL/uL Final  . Hemoglobin 10/26/2011 13.3  12.0 - 15.0 g/dL Final  . HCT 47/82/9562 39.1  36.0 - 46.0 % Final  . MCV 10/26/2011 92.7  78.0 - 100.0 fL Final  . MCH 10/26/2011 31.5  26.0 - 34.0 pg Final  . MCHC 10/26/2011 34.0  30.0 - 36.0 g/dL Final  . RDW 13/01/6577 12.5  11.5 - 15.5 % Final  . Platelets 10/26/2011 318  150 - 400 K/uL Final  . Sodium 10/26/2011 138  135 - 145 mEq/L Final  . Potassium 10/26/2011 4.6  3.5 - 5.1 mEq/L Final  . Chloride 10/26/2011 99  96 - 112 mEq/L Final  .  CO2 10/26/2011 30  19 - 32 mEq/L Final  . Glucose, Bld 10/26/2011 79  70 - 99 mg/dL Final  . BUN 16/03/9603 14  6 - 23 mg/dL Final  . Creatinine, Ser 10/26/2011 0.84  0.50 - 1.10 mg/dL Final  . Calcium 54/02/8118 9.9  8.4 - 10.5 mg/dL Final  . Total Protein 10/26/2011 7.1  6.0 - 8.3 g/dL Final  . Albumin 14/78/2956 4.0  3.5 - 5.2 g/dL Final  . AST 21/30/8657 24  0 - 37 U/L Final  . ALT 10/26/2011 19  0 - 35 U/L Final  . Alkaline Phosphatase 10/26/2011 76  39 - 117 U/L Final  . Total Bilirubin 10/26/2011 0.7  0.3 - 1.2 mg/dL Final  . GFR calc non Af Amer 10/26/2011 75* >90 mL/min Final  . GFR calc Af Amer 10/26/2011 87* >90 mL/min Final   Comment:                                 The eGFR has been calculated                          using the CKD EPI equation.                          This calculation has not been                          validated in all clinical                          situations.                          eGFR's persistently                          <90 mL/min signify                          possible Chronic Kidney Disease.  Marland Kitchen Prothrombin Time 10/26/2011 12.4  11.6 - 15.2 seconds Final  . INR 10/26/2011 0.91  0.00 - 1.49 Final  . Color, Urine 10/26/2011 YELLOW  YELLOW Final  . APPearance 10/26/2011 CLEAR  CLEAR Final  . Specific Gravity, Urine 10/26/2011 1.013  1.005 - 1.030 Final  . pH 10/26/2011 7.5  5.0 - 8.0 Final  . Glucose, UA 10/26/2011 NEGATIVE   NEGATIVE mg/dL Final  . Hgb urine dipstick 10/26/2011 NEGATIVE  NEGATIVE Final  . Bilirubin Urine 10/26/2011 NEGATIVE  NEGATIVE Final  . Ketones, ur 10/26/2011 NEGATIVE  NEGATIVE mg/dL Final  . Protein, ur 84/69/6295 NEGATIVE  NEGATIVE mg/dL Final  . Urobilinogen, UA 10/26/2011 0.2  0.0 - 1.0 mg/dL Final  . Nitrite 28/41/3244 NEGATIVE  NEGATIVE Final  . Leukocytes, UA 10/26/2011 NEGATIVE  NEGATIVE Final   MICROSCOPIC NOT DONE ON URINES WITH NEGATIVE PROTEIN, BLOOD, LEUKOCYTES, NITRITE, OR GLUCOSE <1000 mg/dL.   No results found for this basename: HGB:5 in the last 72 hours No results found for this basename: WBC:2,RBC:2,HCT:2,PLT:2 in the last 72 hours No results found for this basename: NA:2,K:2,CL:2,CO2:2,BUN:2,CREATININE:2,GLUCOSE:2,CALCIUM:2 in the last 72 hours No results found for this basename: LABPT:2,INR:2 in the last 72 hours  X-Rays:Dg Hip Complete Left  10/26/2011  *  RADIOLOGY REPORT*  Clinical Data: Preop, left hip replacement  LEFT HIP - COMPLETE 2+ VIEW  Comparison: None.  Findings: 3 views of the left hip submitted.  No acute fracture or subluxation.  Degenerative changes are noted with narrowing of superior joint space.  Degenerative changes are noted pubic symphysis.  IMPRESSION: No acute fracture or subluxation.  Degenerative changes left hip joint with narrowing of superior hip joint space. Degenerative changes pubic symphysis.  Original Report Authenticated By: Natasha Mead, M.D.   Dg Pelvis Portable  11/09/2011  *RADIOLOGY REPORT*  Clinical Data: Post left hip prosthesis.  PORTABLE PELVIS  Comparison: Left hip films performedat the same time.  Findings: Proximal aspect of total left hip replacement is in satisfactory position.  No complicating features.  Surgical drain is in place.  Degenerative changes pubic symphysis.  IMPRESSION: Satisfactory appearance proximal aspect of the left total hip replacement.  Original Report Authenticated By: Fuller Canada, M.D.   Dg Hip  Portable 1 View Left  11/09/2011  *RADIOLOGY REPORT*  Clinical Data: Postop left hip replacement.  PORTABLE LEFT HIP - 1 VIEW  Comparison: 10/26/2011  Findings: Changes of left hip replacement.  Soft tissue drain in place.  No hardware or bony complicating feature.  IMPRESSION: Left hip replacement.  No complicating feature.  Original Report Authenticated By: Cyndie Chime, M.D.    EKG: Orders placed in visit on 10/07/11  . EKG 12-LEAD     Hospital Course: Patient was admitted to [facility] and taken to the OR and underwent the above state procedure without complications.  Patient tolerated the procedure well and was later transferred to the recovery room and then to the orthopaedic floor for postoperative care.  They were given PO and IV analgesics for pain control following their surgery.  They were given 24 hours of postoperative antibiotics and started on DVT prophylaxis in the form of Xarelto.   PT and OT were ordered for total hip protocol.  The patient was allowed to be PWB with therapy. Discharge planning was consulted to help with postop disposition and equipment needs.  Patient had a decent night on the evening of surgery and started to get up OOB with therapy on day one.  Hemovac drain was pulled without difficulty.  The knee immobilizer was removed and discontinued.  Continued to work with therapy into day two.  Dressing was changed on day two and the incision was healing well.  By day three, the patient had progressed with therapy and meeting their goals.  Incision was healing well.  Patient was seen in rounds and was ready to go home.  Discharge Medications: Prior to Admission medications   Medication Sig Start Date End Date Taking? Authorizing Provider  atenolol (TENORMIN) 25 MG tablet Take 25 mg by mouth every morning.     Yes Historical Provider, MD  Azelastine HCl (ASTEPRO NA) Place into the nose 2 (two) times daily as needed. For allergies   Yes Historical Provider, MD    docusate sodium (COLACE) 100 MG capsule Take 100 mg by mouth 2 (two) times daily as needed. CONSTIPATION     Yes Historical Provider, MD  enalapril (VASOTEC) 10 MG tablet Take 10 mg by mouth every morning.     Yes Historical Provider, MD  pantoprazole (PROTONIX) 40 MG tablet Take 40 mg by mouth daily with breakfast.    Yes Historical Provider, MD  PARoxetine (PAXIL) 20 MG tablet Take 30 mg by mouth at bedtime.    Yes  Historical Provider, MD  rOPINIRole (REQUIP) 0.5 MG tablet Take 0.5 mg by mouth 2 (two) times daily.   Yes Historical Provider, MD  traMADol (ULTRAM) 50 MG tablet Take 100 mg by mouth every 6 (six) hours as needed. For pain. Maximum dose= 8 tablets per day.pain      Yes Historical Provider, MD  iron polysaccharides (NIFEREX) 150 MG capsule Take 1 capsule (150 mg total) by mouth 2 (two) times daily. 11/11/11 11/10/12  Cartrell Bentsen, PA  methocarbamol (ROBAXIN) 500 MG tablet Take 1 tablet (500 mg total) by mouth every 6 (six) hours as needed. 11/11/11 11/21/11  Hiya Point, PA  oxyCODONE (OXY IR/ROXICODONE) 5 MG immediate release tablet Take 1-2 tablets (5-10 mg total) by mouth every 4 (four) hours as needed for pain. 11/11/11 11/21/11  Takina Busser, PA  polyethylene glycol powder (MIRALAX) powder Take 17 g by mouth daily as needed. CONSTIPATION     Historical Provider, MD  rivaroxaban (XARELTO) 10 MG TABS tablet Take 1 tablet (10 mg total) by mouth daily with breakfast. Take Xarelto for two and a half more weeks, then discontinue Xarelto. Once the patient has completed the Xarelto, they may resume the 81 mg Aspirin. 11/11/11   Esterlene Atiyeh, PA  triamcinolone (NASACORT) 55 MCG/ACT nasal inhaler Place 2 sprays into the nose daily as needed. For allergies. Uses one or the other nose spray not both alternates    Historical Provider, MD    Diet: Cardiac diet Activity:WBAT No bending hip over 90 degrees- A "L" Angle Do not cross legs Do not let foot roll inward When  turning these patients a pillow should be placed between the patient's legs to prevent crossing. Patients should have the affected knee fully extended when trying to sit or stand from all surfaces to prevent excessive hip flexion. When ambulating and turning toward the affected side the affected leg should have the toes turned out prior to moving the walker and the rest of patient's body as to prevent internal rotation/ turning in of the leg. Abduction pillows are the most effective way to prevent a patient from not crossing legs or turning toes in at rest. If an abduction pillow is not ordered placing a regular pillow length wise between the patient's legs is also an effective reminder. It is imperative that these precautions be maintained so that the surgical hip does not dislocate. Follow-up:in 2 weeks Disposition - Home Discharged Condition: good   Discharge Orders    Future Orders Please Complete By Expires   Diet - low sodium heart healthy      Diet Carb Modified      Call MD / Call 911      Comments:   If you experience chest pain or shortness of breath, CALL 911 and be transported to the hospital emergency room.  If you develope a fever above 101 F, pus (white drainage) or increased drainage or redness at the wound, or calf pain, call your surgeon's office.   Discharge instructions      Comments:   Pick up stool softner and laxative for home. Do not submerge incision under water. May shower. Continue to use ice for pain and swelling from surgery. Hip precautions.  Total Hip Protocol.  Take Xarelto for two and a half more weeks, then discontinue Xarelto. Once the patient has completed the Xarelto, they may resume the 81 mg Aspirin.   Constipation Prevention      Comments:   Drink plenty of fluids.  Prune juice  may be helpful.  You may use a stool softener, such as Colace (over the counter) 100 mg twice a day.  Use MiraLax (over the counter) for constipation as needed.   Increase  activity slowly as tolerated      Patient may shower      Comments:   You may shower without a dressing once there is no drainage.  Do not wash over the wound.  If drainage remains, do not shower until drainage stops.   Driving restrictions      Comments:   No driving until released by the physician.   Lifting restrictions      Comments:   No lifting until released by the physician.   Follow the hip precautions as taught in Physical Therapy      Change dressing      Comments:   You may change your dressing dressing daily with sterile 4 x 4 inch gauze dressing and paper tape.  Do not submerge the incision under water.   TED hose      Comments:   Use stockings (TED hose) for 3 weeks on both leg(s).  You may remove them at night for sleeping.   Do not sit on low chairs, stoools or toilet seats, as it may be difficult to get up from low surfaces        Medication List  As of 11/18/2011  1:34 PM   STOP taking these medications         aspirin 81 MG tablet      HYDROcodone-acetaminophen 5-325 MG per tablet      multivitamins ther. w/minerals Tabs      NON FORMULARY         TAKE these medications         ASTEPRO NA   Place into the nose 2 (two) times daily as needed. For allergies      atenolol 25 MG tablet   Commonly known as: TENORMIN   Take 25 mg by mouth every morning.      docusate sodium 100 MG capsule   Commonly known as: COLACE   Take 100 mg by mouth 2 (two) times daily as needed. CONSTIPATION        enalapril 10 MG tablet   Commonly known as: VASOTEC   Take 10 mg by mouth every morning.      iron polysaccharides 150 MG capsule   Commonly known as: NIFEREX   Take 1 capsule (150 mg total) by mouth 2 (two) times daily.      methocarbamol 500 MG tablet   Commonly known as: ROBAXIN   Take 1 tablet (500 mg total) by mouth every 6 (six) hours as needed.      MIRALAX powder   Generic drug: polyethylene glycol powder   Take 17 g by mouth daily as needed.  CONSTIPATION      oxyCODONE 5 MG immediate release tablet   Commonly known as: Oxy IR/ROXICODONE   Take 1-2 tablets (5-10 mg total) by mouth every 4 (four) hours as needed for pain.      pantoprazole 40 MG tablet   Commonly known as: PROTONIX   Take 40 mg by mouth daily with breakfast.      PARoxetine 20 MG tablet   Commonly known as: PAXIL   Take 30 mg by mouth at bedtime.      rivaroxaban 10 MG Tabs tablet   Commonly known as: XARELTO   Take 1 tablet (10 mg total) by mouth  daily with breakfast. Take Xarelto for two and a half more weeks, then discontinue Xarelto.  Once the patient has completed the Xarelto, they may resume the 81 mg Aspirin.      rOPINIRole 0.5 MG tablet   Commonly known as: REQUIP   Take 0.5 mg by mouth 2 (two) times daily.      traMADol 50 MG tablet   Commonly known as: ULTRAM   Take 100 mg by mouth every 6 (six) hours as needed. For pain. Maximum dose= 8 tablets per day.pain           triamcinolone 55 MCG/ACT nasal inhaler   Commonly known as: NASACORT   Place 2 sprays into the nose daily as needed. For allergies. Uses one or the other nose spray not both alternates           Follow-up Information    Follow up with Loanne Drilling, MD. Schedule an appointment as soon as possible for a visit in 2 weeks.   Contact information:   [facility] [address] [place] [address] [phone]          Signed: Patrica Duel 11/18/2011, 1:34 PM

## 2012-08-01 ENCOUNTER — Other Ambulatory Visit: Payer: Self-pay

## 2012-08-01 DIAGNOSIS — Z1231 Encounter for screening mammogram for malignant neoplasm of breast: Secondary | ICD-10-CM

## 2012-08-15 ENCOUNTER — Ambulatory Visit
Admission: RE | Admit: 2012-08-15 | Discharge: 2012-08-15 | Disposition: A | Discharge status: Home / Self Care | Payer: 59 | Source: Ambulatory Visit

## 2012-08-17 ENCOUNTER — Other Ambulatory Visit: Payer: Self-pay | Admitting: Obstetrics and Gynecology

## 2012-08-29 ENCOUNTER — Ambulatory Visit
Admission: RE | Admit: 2012-08-29 | Discharge: 2012-08-29 | Disposition: A | Discharge status: Home / Self Care | Payer: 59 | Source: Ambulatory Visit | Attending: Obstetrics and Gynecology | Admitting: Obstetrics and Gynecology

## 2012-08-29 DIAGNOSIS — R928 Other abnormal and inconclusive findings on diagnostic imaging of breast: Secondary | ICD-10-CM

## 2012-12-03 ENCOUNTER — Ambulatory Visit (INDEPENDENT_AMBULATORY_CARE_PROVIDER_SITE_OTHER): Payer: 59 | Admitting: Family Medicine

## 2012-12-03 VITALS — BP 123/78 | HR 87 | Temp 98.0°F | Resp 18 | Ht 69.0 in | Wt 166.6 lb

## 2012-12-03 DIAGNOSIS — L0390 Cellulitis, unspecified: Secondary | ICD-10-CM

## 2012-12-03 DIAGNOSIS — T2124XA Burn of second degree of lower back, initial encounter: Secondary | ICD-10-CM

## 2012-12-03 DIAGNOSIS — T2125XA Burn of second degree of buttock, initial encounter: Secondary | ICD-10-CM

## 2012-12-03 DIAGNOSIS — L0291 Cutaneous abscess, unspecified: Secondary | ICD-10-CM

## 2012-12-03 LAB — POCT CBC
Lymph, poc: 1.9 (ref 0.6–3.4)
MCHC: 32 g/dL (ref 31.8–35.4)
MPV: 7.8 fL (ref 0–99.8)
POC Granulocyte: 5.5 (ref 2–6.9)
POC LYMPH PERCENT: 23.9 %L (ref 10–50)
POC MID %: 7.1 %M (ref 0–12)
Platelet Count, POC: 302 10*3/uL (ref 142–424)
RDW, POC: 12.4 %

## 2012-12-03 MED ORDER — CEPHALEXIN 500 MG PO CAPS: 500.0000 mg | ORAL_CAPSULE | Freq: Four times a day (QID) | ORAL | Status: DC

## 2012-12-03 MED ORDER — HYDROCODONE-ACETAMINOPHEN 5-325 MG PO TABS: 1.0000 | ORAL_TABLET | Freq: Four times a day (QID) | ORAL | Status: DC | PRN
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
## 2012-12-03 NOTE — Progress Notes (Deleted)
  Subjective:    Patient ID: Brenda Short, female    DOB: 09-30-1955, 57 y.o.   MRN: [id number]  HPI    Review of Systems     Objective:   Physical Exam        Assessment & Plan:

## 2012-12-03 NOTE — Patient Instructions (Addendum)
Keflex 3 times daily for infection  Hydrocodone  Every 4-6 hrs as needed  Return if worse.

## 2013-09-04 ENCOUNTER — Other Ambulatory Visit: Payer: Self-pay

## 2013-09-04 DIAGNOSIS — Z1231 Encounter for screening mammogram for malignant neoplasm of breast: Secondary | ICD-10-CM

## 2013-09-06 ENCOUNTER — Ambulatory Visit
Admission: RE | Admit: 2013-09-06 | Discharge: 2013-09-06 | Disposition: A | Discharge status: Home / Self Care | Payer: 59 | Source: Ambulatory Visit

## 2013-09-06 ENCOUNTER — Other Ambulatory Visit: Payer: Self-pay | Admitting: Internal Medicine

## 2013-09-06 ENCOUNTER — Ambulatory Visit
Admission: RE | Admit: 2013-09-06 | Discharge: 2013-09-06 | Disposition: A | Discharge status: Home / Self Care | Payer: 59 | Source: Ambulatory Visit | Attending: Internal Medicine | Admitting: Internal Medicine

## 2013-09-06 ENCOUNTER — Other Ambulatory Visit: Payer: Self-pay | Admitting: Obstetrics and Gynecology

## 2013-09-06 DIAGNOSIS — Z1231 Encounter for screening mammogram for malignant neoplasm of breast: Secondary | ICD-10-CM

## 2013-09-07 ENCOUNTER — Encounter (HOSPITAL_BASED_OUTPATIENT_CLINIC_OR_DEPARTMENT_OTHER): Payer: Self-pay | Admitting: Emergency Medicine

## 2013-09-07 ENCOUNTER — Emergency Department (HOSPITAL_BASED_OUTPATIENT_CLINIC_OR_DEPARTMENT_OTHER)
Admission: EM | Admit: 2013-09-07 | Discharge: 2013-09-07 | Disposition: A | Discharge status: Home / Self Care | Payer: 59 | Attending: Emergency Medicine | Admitting: Emergency Medicine

## 2013-09-07 DIAGNOSIS — R51 Headache: Secondary | ICD-10-CM | POA: Insufficient documentation

## 2013-09-07 DIAGNOSIS — Z79899 Other long term (current) drug therapy: Secondary | ICD-10-CM | POA: Insufficient documentation

## 2013-09-07 DIAGNOSIS — R519 Headache, unspecified: Secondary | ICD-10-CM

## 2013-09-07 DIAGNOSIS — R42 Dizziness and giddiness: Secondary | ICD-10-CM | POA: Insufficient documentation

## 2013-09-07 DIAGNOSIS — I10 Essential (primary) hypertension: Secondary | ICD-10-CM | POA: Insufficient documentation

## 2013-09-07 DIAGNOSIS — R040 Epistaxis: Secondary | ICD-10-CM | POA: Diagnosis present

## 2013-09-07 DIAGNOSIS — I059 Rheumatic mitral valve disease, unspecified: Secondary | ICD-10-CM | POA: Insufficient documentation

## 2013-09-07 DIAGNOSIS — Z87828 Personal history of other (healed) physical injury and trauma: Secondary | ICD-10-CM | POA: Insufficient documentation

## 2013-09-07 DIAGNOSIS — K219 Gastro-esophageal reflux disease without esophagitis: Secondary | ICD-10-CM | POA: Insufficient documentation

## 2013-09-07 DIAGNOSIS — F411 Generalized anxiety disorder: Secondary | ICD-10-CM | POA: Insufficient documentation

## 2013-09-07 DIAGNOSIS — Z792 Long term (current) use of antibiotics: Secondary | ICD-10-CM | POA: Insufficient documentation

## 2013-09-07 MED ORDER — OXYMETAZOLINE HCL 0.05 % NA SOLN
1.0000 | Freq: Once | NASAL | Status: AC
  Administered 2013-09-07: 1 via NASAL

## 2013-09-07 MED ORDER — DIPHENHYDRAMINE HCL 50 MG/ML IJ SOLN
25.0000 mg | Freq: Once | INTRAMUSCULAR | Status: AC
  Administered 2013-09-07: 25 mg via INTRAVENOUS
  Filled 2013-09-07: qty 1

## 2013-09-07 MED ORDER — ACETAMINOPHEN 325 MG PO TABS
650.0000 mg | ORAL_TABLET | Freq: Once | ORAL | Status: AC
  Administered 2013-09-07: 650 mg via ORAL
  Filled 2013-09-07: qty 2

## 2013-09-07 MED ORDER — SILVER NITRATE-POT NITRATE 75-25 % EX MISC
CUTANEOUS | Status: AC
  Administered 2013-09-07: 1 via TOPICAL
  Filled 2013-09-07: qty 2

## 2013-09-07 MED ORDER — SILVER NITRATE-POT NITRATE 75-25 % EX MISC
1.0000 "application " | Freq: Once | CUTANEOUS | Status: AC
  Administered 2013-09-07: 1 via TOPICAL

## 2013-09-07 MED ORDER — METOCLOPRAMIDE HCL 5 MG/ML IJ SOLN
5.0000 mg | Freq: Once | INTRAMUSCULAR | Status: AC
  Administered 2013-09-07: 5 mg via INTRAVENOUS
  Filled 2013-09-07: qty 2

## 2013-09-07 MED ORDER — SODIUM CHLORIDE 0.9 % IV BOLUS (SEPSIS)
1000.0000 mL | INTRAVENOUS | Status: AC
  Administered 2013-09-07: 1000 mL via INTRAVENOUS

## 2013-09-07 MED ORDER — OXYMETAZOLINE HCL 0.05 % NA SOLN
NASAL | Status: AC
  Administered 2013-09-07: 1 via NASAL
  Filled 2013-09-07: qty 15

## 2013-09-07 NOTE — ED Notes (Signed)
Pt reports nosebleed since 5 pm- states has been having more frequent nosebleeds over that last month

## 2013-09-07 NOTE — ED Provider Notes (Signed)
CSN: [id number]     Arrival date & time 09/07/13  1478 History  This chart was scribed for No att. providers found by Elveria Rising, ED scribe.  This patient was seen in room MH09/MH09 and the patient's care was started at 7:32 PM.   Chief Complaint  Patient presents with  . Epistaxis      Patient is a 58 y.o. female presenting with nosebleeds. The history is provided by the patient. No language interpreter was used.  Epistaxis Location:  Bilateral Severity:  Severe Timing:  Intermittent Progression:  Worsening Chronicity:  Recurrent Relieved by:  Applying pressure Worsened by:  Nothing tried Associated symptoms: headaches   Associated symptoms: no congestion, no fever and no sore throat   Risk factors: frequent nosebleeds    HPI Comments: Brenda Short is a 58 y.o. female with history of HTN who presents to the Emergency Department complaining of epistaxis, onset 2.5 hours ago. Patient reports that her nose began bleeding 2.5 hours ago and bled consistently for 45 minutes. The bleeding stopped, but started again and has bleed for the last 45 minutes. Patient reports clots and bleeding from both nostrils. Patient reports lightheadedness and a mild headache throughout the day today.Patient reports her nosebleeds have become more frequent over the last month: 2-3 nosebleeds a week. Causation for her frequent nosebleeds has not been found and this is the first time the patient has been to ED for her nosebleeds. She is usually able to control the bleeding at home with compression of the nostrils. Patient has been using OTC Flamicort throughout the month to treat her nosebleeds. Patient denies having headaches related to her previous nosebleeds. Patient takes aleve regularly.    Past Medical History  Diagnosis Date  . Hypertension   . Headache(784.0)     TAKES ATENOLOL  . MVP (mitral valve prolapse)   . GERD (gastroesophageal reflux disease)   . Glenoid labral tear   . Anxiety   .  PONV (postoperative nausea and vomiting)     WITH [facility] DRIP/ no problems 9'13 with hip scope  . Allergy    Past Surgical History  Procedure Laterality Date  . Abdominal hysterectomy    . Hip arthroscopy      2010 L HIP ARTHROSCOPY   . Tubal ligation      1986  . Breast surgery      DUCTS REMOVED FROM RT BREAST 2008  . Hip arthroscopy  04/20/2011    Procedure: ARTHROSCOPY HIP;  Surgeon: Gus Rankin Aluisio;  Location: [facility];  Service: Orthopedics;  Laterality: Left;  debridement labral tear, chondroplasty  . Total hip arthroplasty  11/09/2011    Procedure: TOTAL HIP ARTHROPLASTY;  Surgeon: Loanne Drilling, MD;  Location: [facility];  Service: Orthopedics;  Laterality: Left;  . Joint replacement     Family History  Problem Relation Age of Onset  . Heart disease      No family history of   History  Substance Use Topics  . Smoking status: Never Smoker   . Smokeless tobacco: Never Used  . Alcohol Use: 0.6 oz/week    1 Glasses of wine per week     Comment: 1-2 glasses of wine per day   OB History   Grav Para Term Preterm Abortions TAB SAB Ect Mult Living                 Review of Systems  Constitutional: Negative for fever.  HENT: Positive for nosebleeds. Negative for congestion  and sore throat.   Eyes: Negative for pain.  Respiratory: Negative for choking, shortness of breath and wheezing.   Cardiovascular: Negative for chest pain.  Gastrointestinal: Negative for vomiting, abdominal pain and diarrhea.  Genitourinary: Negative for dysuria.  Musculoskeletal: Negative for neck pain.  Skin: Negative for rash.  Allergic/Immunologic: Negative for immunocompromised state.  Neurological: Positive for light-headedness and headaches.  Hematological: Negative for adenopathy.  Psychiatric/Behavioral: Negative for behavioral problems.      Allergies  Morphine and related  Home Medications   Current Outpatient Rx  Name  Route  Sig  Dispense  Refill  . amitriptyline (ELAVIL) 50  MG tablet   Oral   Take 50 mg by mouth at bedtime.         Marland Kitchen. atenolol (TENORMIN) 25 MG tablet   Oral   Take 25 mg by mouth every morning.           . Azelastine HCl (ASTEPRO NA)   Nasal   Place into the nose 2 (two) times daily as needed. For allergies         . docusate sodium (COLACE) 100 MG capsule   Oral   Take 100 mg by mouth 2 (two) times daily as needed. CONSTIPATION           . enalapril (VASOTEC) 10 MG tablet   Oral   Take 10 mg by mouth every morning.           . pantoprazole (PROTONIX) 40 MG tablet   Oral   Take 40 mg by mouth daily with breakfast.          . PARoxetine (PAXIL) 20 MG tablet   Oral   Take 30 mg by mouth at bedtime.          . polyethylene glycol powder (MIRALAX) powder   Oral   Take 17 g by mouth daily as needed. CONSTIPATION          . traMADol (ULTRAM) 50 MG tablet   Oral   Take 100 mg by mouth every 6 (six) hours as needed. For pain. Maximum dose= 8 tablets per day.pain            . triamcinolone (NASACORT) 55 MCG/ACT nasal inhaler   Nasal   Place 2 sprays into the nose daily as needed. For allergies. Uses one or the other nose spray not both alternates         . cephALEXin (KEFLEX) 500 MG capsule   Oral   Take 1 capsule (500 mg total) by mouth 4 (four) times daily.   21 capsule   0   . HYDROcodone-acetaminophen (NORCO) 5-325 MG per tablet   Oral   Take 1 tablet by mouth every 6 (six) hours as needed for pain. One every 4-6 hours for severe pain only.   15 tablet   0   . EXPIRED: iron polysaccharides (NIFEREX) 150 MG capsule   Oral   Take 1 capsule (150 mg total) by mouth 2 (two) times daily.   42 capsule   0   . rivaroxaban (XARELTO) 10 MG TABS tablet   Oral   Take 1 tablet (10 mg total) by mouth daily with breakfast. Take Xarelto for two and a half more weeks, then discontinue Xarelto. Once the patient has completed the Xarelto, they may resume the 81 mg Aspirin.   18 tablet   0   . rOPINIRole  (REQUIP) 0.5 MG tablet   Oral   Take 0.5 mg by mouth  2 (two) times daily.          Triage Vitals: BP 159/94  Pulse 74  Temp(Src) 98.4 F (36.9 C) (Oral)  Resp 20  Ht 5\' 10"  (1.778 m)  Wt 170 lb (77.111 kg)  BMI 24.39 kg/m2  SpO2 98% Physical Exam  Nursing note and vitals reviewed. Constitutional: She is oriented to person, place, and time. She appears well-developed and well-nourished. No distress.  HENT:  Head: Normocephalic and atraumatic.  Mouth/Throat: Oropharynx is clear and moist. No oropharyngeal exudate.  Normal appearing right internal nare.  Active extravasation from a small blood vessel on the anterior septum of the left nare.  Eyes: Conjunctivae and EOM are normal. Pupils are equal, round, and reactive to light. Right eye exhibits no discharge. Left eye exhibits no discharge.  Neck: Neck supple. No tracheal deviation present.  Cardiovascular: Normal rate, regular rhythm, normal heart sounds and intact distal pulses.  Exam reveals no gallop and no friction rub.   No murmur heard. Pulmonary/Chest: Effort normal and breath sounds normal. No respiratory distress. She has no wheezes. She has no rales. She exhibits no tenderness.  Abdominal: Soft. Bowel sounds are normal. She exhibits no distension and no mass. There is no tenderness. There is no rebound and no guarding.  Musculoskeletal: Normal range of motion. She exhibits no edema and no tenderness.  Neurological: She is alert and oriented to person, place, and time.  Skin: Skin is warm and dry. No rash noted. No erythema.  Psychiatric: She has a normal mood and affect. Her behavior is normal.    ED Course  Procedures (including critical care time) DIAGNOSTIC STUDIES: Oxygen Saturation is 98% on room air, normal by my interpretation.    COORDINATION OF CARE: 7:52 PM- Pt advised of plan for treatment and pt agrees.    Labs Review Labs Reviewed - No data to display Imaging Review   EKG Interpretation None       MDM   Final diagnoses:  Epistaxis, recurrent  Headache    11:06 PM 58 y.o. female who presents with epistaxis thought to be from bilateral nares. She notes several episodes of epistaxis weekly for the last month. Of note she started a new medication, Nasacort in the last month. She is afebrile and vital signs are unremarkable here. She has obvious bleeding from the septum of the left anterior nare.   Afrin and compression used initially w/out relief.   I used silver nitrate on the bleeding vessel in the left nare w/out relief.   Anterior nasal tampon placed to left nare w/ final tamponade of bleeding. Pt now w/ worsening HA. Will tx w/ IVF and reglan/benadryl.   11:06 PM: Bleeding remains stopped. HA gone. Offered screening cbc, but I don't think this is necessary as pt continues to appear well. The patient and husband agreed. Will leave nasal tampon in place and have pt f/u w/ ENT on Monday for repeat eval.  I have discussed the diagnosis/risks/treatment options with the patient and believe the pt to be eligible for discharge home to follow-up with ENT on monday. We also discussed returning to the ED immediately if new or worsening sx occur. We discussed the sx which are most concerning (e.g., return of epistaxis, lightheadedness, dizziness, shortness of breath, weakness) that necessitate immediate return. Medications administered to the patient during their visit and any new prescriptions provided to the patient are listed below.  Medications given during this visit Medications  sodium chloride 0.9 % bolus 1,000 mL (  1,000 mLs Intravenous New Bag/Given 09/07/13 2222)  oxymetazoline (AFRIN) 0.05 % nasal spray 1 spray (1 spray Each Nare Given 09/07/13 2242)  silver nitrate applicators applicator 1 application (1 application Topical Given 09/07/13 2241)  acetaminophen (TYLENOL) tablet 650 mg (650 mg Oral Given 09/07/13 2135)  metoCLOPramide (REGLAN) injection 5 mg (5 mg Intravenous Given 09/07/13  2226)  diphenhydrAMINE (BENADRYL) injection 25 mg (25 mg Intravenous Given 09/07/13 2227)    New Prescriptions   No medications on file  .   I personally performed the services described in this documentation, which was scribed in my presence. The recorded information has been reviewed and is accurate.    Junius Argyle, MD 09/07/13 2352

## 2014-07-27 ENCOUNTER — Emergency Department (INDEPENDENT_AMBULATORY_CARE_PROVIDER_SITE_OTHER): Payer: 59

## 2014-07-27 ENCOUNTER — Encounter: Payer: Self-pay | Admitting: Emergency Medicine

## 2014-07-27 ENCOUNTER — Emergency Department
Admission: EM | Admit: 2014-07-27 | Discharge: 2014-07-27 | Disposition: A | Discharge status: Home / Self Care | Payer: 59 | Source: Home / Self Care | Attending: Emergency Medicine | Admitting: Emergency Medicine

## 2014-07-27 DIAGNOSIS — J208 Acute bronchitis due to other specified organisms: Secondary | ICD-10-CM | POA: Diagnosis not present

## 2014-07-27 DIAGNOSIS — R05 Cough: Secondary | ICD-10-CM

## 2014-07-27 DIAGNOSIS — R059 Cough, unspecified: Secondary | ICD-10-CM

## 2014-07-27 DIAGNOSIS — R0602 Shortness of breath: Secondary | ICD-10-CM

## 2014-07-27 MED ORDER — IPRATROPIUM-ALBUTEROL 0.5-2.5 (3) MG/3ML IN SOLN: 3.0000 mL | RESPIRATORY_TRACT | Status: DC

## 2014-07-27 MED ORDER — HYDROCOD POLST-CHLORPHEN POLST 10-8 MG/5ML PO LQCR: 5.0000 mL | Freq: Two times a day (BID) | ORAL | Status: DC | PRN

## 2014-07-27 MED ORDER — METHYLPREDNISOLONE SODIUM SUCC 125 MG IJ SOLR: 125.0000 mg | Freq: Once | INTRAMUSCULAR | Status: DC

## 2014-07-27 MED ORDER — METHYLPREDNISOLONE SODIUM SUCC 125 MG IJ SOLR
125.0000 mg | Freq: Once | INTRAMUSCULAR | Status: AC
  Administered 2014-07-27: 125 mg via INTRAMUSCULAR

## 2014-07-27 MED ORDER — PREDNISONE 10 MG PO TABS: ORAL_TABLET | ORAL | Status: DC

## 2014-07-27 NOTE — Discharge Instructions (Signed)

## 2014-07-27 NOTE — ED Notes (Signed)
Pt c/o cough and chest congestion x1 week. States she was [place] and given cefdinir and proair and she is not improving.

## 2014-07-28 NOTE — ED Provider Notes (Signed)
CSN: [id number]     Arrival date & time 07/27/14  1724 History   First MD Initiated Contact with Patient 07/27/14 1744     Chief Complaint  Patient presents with  . Cough     (Consider location/radiation/quality/duration/timing/severity/associated sxs/prior Treatment) Patient is a 59 y.o. female presenting with cough. The history is provided by the patient. No language interpreter was used.  Cough Cough characteristics:  Non-productive Severity:  Moderate Onset quality:  Gradual Duration:  1 week Timing:  Constant Progression:  Worsening Chronicity:  New Smoker: no   Relieved by:  Nothing Worsened by:  Nothing tried Ineffective treatments:  None tried Associated symptoms: no shortness of breath   Risk factors: recent infection     Past Medical History  Diagnosis Date  . Hypertension   . Headache(784.0)     TAKES ATENOLOL  . MVP (mitral valve prolapse)   . GERD (gastroesophageal reflux disease)   . Glenoid labral tear   . Anxiety   . PONV (postoperative nausea and vomiting)     WITH [facility] DRIP/ no problems 9'13 with hip scope  . Allergy    Past Surgical History  Procedure Laterality Date  . Abdominal hysterectomy    . Hip arthroscopy      2010 L HIP ARTHROSCOPY   . Tubal ligation      1986  . Breast surgery      DUCTS REMOVED FROM RT BREAST 2008  . Hip arthroscopy  04/20/2011    Procedure: ARTHROSCOPY HIP;  Surgeon: Gus Rankin Aluisio;  Location: [facility];  Service: Orthopedics;  Laterality: Left;  debridement labral tear, chondroplasty  . Total hip arthroplasty  11/09/2011    Procedure: TOTAL HIP ARTHROPLASTY;  Surgeon: Loanne Drilling, MD;  Location: [facility];  Service: Orthopedics;  Laterality: Left;  . Joint replacement     Family History  Problem Relation Age of Onset  . Heart disease      No family history of   History  Substance Use Topics  . Smoking status: Never Smoker   . Smokeless tobacco: Never Used  . Alcohol Use: 0.6 oz/week    1 Glasses of  wine per week     Comment: 1-2 glasses of wine per day   OB History    No data available     Review of Systems  Respiratory: Positive for cough. Negative for shortness of breath.   All other systems reviewed and are negative.     Allergies  Morphine and related  Home Medications   Prior to Admission medications   Medication Sig Start Date End Date Taking? Authorizing Provider  albuterol (PROVENTIL HFA;VENTOLIN HFA) 108 (90 BASE) MCG/ACT inhaler Inhale into the lungs every 6 (six) hours as needed for wheezing or shortness of breath.   Yes Historical Provider, MD  cefdinir (OMNICEF) 300 MG capsule Take 300 mg by mouth 2 (two) times daily.   Yes Historical Provider, MD  amitriptyline (ELAVIL) 50 MG tablet Take 50 mg by mouth at bedtime.    Historical Provider, MD  atenolol (TENORMIN) 25 MG tablet Take 25 mg by mouth every morning.      Historical Provider, MD  Azelastine HCl (ASTEPRO NA) Place into the nose 2 (two) times daily as needed. For allergies    Historical Provider, MD  cephALEXin (KEFLEX) 500 MG capsule Take 1 capsule (500 mg total) by mouth 4 (four) times daily. 12/03/12   Peyton Najjar, MD  chlorpheniramine-HYDROcodone [facility] PENNKINETIC ER) 10-8 MG/5ML [facility] Take  5 mLs by mouth every 12 (twelve) hours as needed for cough. 07/27/14   Elson Areas, PA-C  docusate sodium (COLACE) 100 MG capsule Take 100 mg by mouth 2 (two) times daily as needed. CONSTIPATION      Historical Provider, MD  enalapril (VASOTEC) 10 MG tablet Take 10 mg by mouth every morning.      Historical Provider, MD  HYDROcodone-acetaminophen (NORCO) 5-325 MG per tablet Take 1 tablet by mouth every 6 (six) hours as needed for pain. One every 4-6 hours for severe pain only. 12/03/12   Peyton Najjar, MD  iron polysaccharides (NIFEREX) 150 MG capsule Take 1 capsule (150 mg total) by mouth 2 (two) times daily. 11/11/11 11/10/12  Avel Peace, PA-C  methylPREDNISolone sodium succinate (SOLU-MEDROL) 125 mg/2 mL  injection Inject 2 mLs (125 mg total) into the muscle once. 07/27/14   Elson Areas, PA-C  pantoprazole (PROTONIX) 40 MG tablet Take 40 mg by mouth daily with breakfast.     Historical Provider, MD  PARoxetine (PAXIL) 20 MG tablet Take 30 mg by mouth at bedtime.     Historical Provider, MD  polyethylene glycol powder (MIRALAX) powder Take 17 g by mouth daily as needed. CONSTIPATION     Historical Provider, MD  predniSONE (DELTASONE) 10 MG tablet 6,5,4,3,2,1 taper 07/27/14   Elson Areas, PA-C  rivaroxaban (XARELTO) 10 MG TABS tablet Take 1 tablet (10 mg total) by mouth daily with breakfast. Take Xarelto for two and a half more weeks, then discontinue Xarelto. Once the patient has completed the Xarelto, they may resume the 81 mg Aspirin. 11/11/11   Avel Peace, PA-C  rOPINIRole (REQUIP) 0.5 MG tablet Take 0.5 mg by mouth 2 (two) times daily.    Historical Provider, MD  traMADol (ULTRAM) 50 MG tablet Take 100 mg by mouth every 6 (six) hours as needed. For pain. Maximum dose= 8 tablets per day.pain       Historical Provider, MD  triamcinolone (NASACORT) 55 MCG/ACT nasal inhaler Place 2 sprays into the nose daily as needed. For allergies. Uses one or the other nose spray not both alternates    Historical Provider, MD   BP 164/64 mmHg  Pulse 66  Temp(Src) 98 F (36.7 C) (Oral)  Wt 170 lb (77.111 kg)  SpO2 97% Physical Exam  Constitutional: She is oriented to person, place, and time. She appears well-developed and well-nourished.  HENT:  Head: Normocephalic and atraumatic.  Mouth/Throat: Oropharynx is clear and moist.  Eyes: EOM are normal. Pupils are equal, round, and reactive to light.  Neck: Normal range of motion.  Pulmonary/Chest: Effort normal. She has wheezes.  Wheezes and rhonchi all lobes   Abdominal: She exhibits no distension.  Musculoskeletal: Normal range of motion.  Neurological: She is alert and oriented to person, place, and time.  Psychiatric: She has a normal mood and  affect.  Nursing note and vitals reviewed. 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 Nonsmoker.  EXAM: CHEST  2 VIEW  COMPARISON:  04/13/2011  FINDINGS: The heart size and mediastinal contours are within normal limits. Both lungs are clear. The visualized skeletal structures are unremarkable.  IMPRESSION: No active cardiopulmonary disease.   Electronically Signed   By: Marisa Cyphers.D.  On: 07/27/2014 18:44     EKG Interpretation None      MDM  Pt given duoneb,  Pt sounds better after neb,  Pt still has rhonchi but no wheezing. Pt feels better   Final diagnoses:  Cough  Acute bronchitis due to other specified organisms    Solumedrol IM Prednisone taper Finish antibiotic USe inhaler Rx for tussionex  Follow up with Dr. Clelia CroftShaw if not improved this week AVD  Elson AreasLeslie K Sofia, PA-C 07/28/14 [id number]

## 2018-04-04 ENCOUNTER — Ambulatory Visit (INDEPENDENT_AMBULATORY_CARE_PROVIDER_SITE_OTHER): Payer: 59 | Admitting: Orthopaedic Surgery

## 2018-04-04 ENCOUNTER — Ambulatory Visit (INDEPENDENT_AMBULATORY_CARE_PROVIDER_SITE_OTHER): Payer: Managed Care, Other (non HMO)

## 2018-04-04 ENCOUNTER — Encounter (INDEPENDENT_AMBULATORY_CARE_PROVIDER_SITE_OTHER): Payer: Self-pay | Admitting: Orthopedic Surgery

## 2018-04-04 ENCOUNTER — Ambulatory Visit (INDEPENDENT_AMBULATORY_CARE_PROVIDER_SITE_OTHER): Payer: Managed Care, Other (non HMO) | Admitting: Orthopedic Surgery

## 2018-04-04 VITALS — BP 103/65 | HR 70 | Resp 16 | Ht 70.0 in | Wt 170.0 lb

## 2018-04-04 DIAGNOSIS — G8929 Other chronic pain: Secondary | ICD-10-CM

## 2018-04-04 DIAGNOSIS — M25511 Pain in right shoulder: Secondary | ICD-10-CM | POA: Diagnosis not present

## 2018-04-04 DIAGNOSIS — M19011 Primary osteoarthritis, right shoulder: Secondary | ICD-10-CM | POA: Diagnosis not present

## 2018-04-04 DIAGNOSIS — M25619 Stiffness of unspecified shoulder, not elsewhere classified: Secondary | ICD-10-CM | POA: Diagnosis not present

## 2018-04-04 MED ORDER — METHYLPREDNISOLONE ACETATE 40 MG/ML IJ SUSP
80.0000 mg | INTRAMUSCULAR | Status: AC | PRN
  Administered 2018-04-04: 80 mg

## 2018-04-04 MED ORDER — BUPIVACAINE HCL 0.5 % IJ SOLN
2.0000 mL | INTRAMUSCULAR | Status: AC | PRN
  Administered 2018-04-04: 2 mL via INTRA_ARTICULAR

## 2018-04-04 MED ORDER — LIDOCAINE HCL 2 % IJ SOLN
2.0000 mL | INTRAMUSCULAR | Status: AC | PRN
  Administered 2018-04-04: 2 mL

## 2018-04-04 NOTE — Progress Notes (Signed)
Office Visit Note   Patient: Brenda Short           Date of Birth: 28-May-1956           MRN: [id number] Visit Date: 04/04/2018              Requested by: Martha Clan, MD [address] [place], [place] [address] PCP: Martha Clan, MD   Assessment & Plan: Visit Diagnoses:  1. Chronic right shoulder pain   2. Primary osteoarthritis, right shoulder   3. Limited range of motion of shoulder     Plan:  #1: At this time we discussed intra-articular and subacromial injection and that was accomplished atraumatically.  She had good relief after injection to the point where she was able to get her sweater on with minimal discomfort. #2: I told her the probably with her radiographic evidence and her pain is probably her next course would be that of a total shoulder replacement. #3: She has to wait till April for insurance benefits with Medicare.  Follow-Up Instructions: No follow-ups on file.  Face-to-face time spent with patient was greater than 45 minutes.  Greater than 50% of the time was spent in counseling and coordination of care. We had a long discussion about not only her problems and the solution to her shoulder but also about insurance details involving that of Medicare versus Medicare insurance.   Orders:  Orders Placed This Encounter  Procedures  . XR Shoulder Right   No orders of the defined types were placed in this encounter.     Procedures: Large Joint Inj: R glenohumeral on 04/04/2018 1:44 PM Indications: pain and diagnostic evaluation Details: 25 G 1.5 in needle, posterior approach  Arthrogram: No  Medications: 2 mL lidocaine 2 %; 2 mL bupivacaine 0.5 %; 80 mg methylPREDNISolone acetate 40 MG/ML Outcome: tolerated well, no immediate complications  The right shoulder was injected not only intra-articularly but subacromial through a posterior approach. Consent was given by the patient. Immediately prior to procedure a time out was called to verify the  correct patient, procedure, equipment, support staff and site/side marked as required. Patient was prepped and draped in the usual sterile fashion.       Clinical Data: No additional findings.   Subjective: Chief Complaint  Patient presents with  . Right Shoulder - Pain  . Shoulder Pain    Right shoulder pain x 4-5 years, Tylenol and  Aleve helps some, ice, heat, difficulty sleeping at night, no injuiry, no right shoulder surgery, not diabetic, limited range of motion, wants injection    HPI  Brenda Short is a very pleasant 62 year old white female who presents with a 4 to 5-year history of right shoulder pain.  She is noted that she is having a limited range of motion now with worsening pain.  She uses Tylenol and Aleve which is only somewhat helpful.  She is iced it and used heat.  Denies any history of injury or trauma.  Her nighttime is the worst.  She has a sleep in an elevated position with pillows appropriately placed to get her any rest at all.  He denies any numbness or tingling in the extremity.  Denies any neck pain.  Seen today for evaluation.  Review of Systems  Constitutional: Positive for fatigue.  HENT: Negative for trouble swallowing.   Eyes: Negative for pain.  Respiratory: Negative for shortness of breath.   Cardiovascular: Negative for leg swelling.  Gastrointestinal: Negative for constipation.  Endocrine: Negative for  cold intolerance.  Genitourinary: Negative for difficulty urinating.  Musculoskeletal: Positive for neck pain and neck stiffness.  Skin: Negative for rash.  Allergic/Immunologic: Negative for food allergies.  Neurological: Positive for weakness.  Hematological: Does not bruise/bleed easily.  Psychiatric/Behavioral: Positive for sleep disturbance.     Objective: Vital Signs: BP 103/65 (BP Location: Left Arm, Patient Position: Sitting, Cuff Size: Normal)   Pulse 70   Resp 16   Ht 5\' 10"  (1.778 m)   Wt 170 lb (77.1 kg)   BMI 24.39 kg/m    Physical Exam  Constitutional: She is oriented to person, place, and time. She appears well-developed and well-nourished.  HENT:  Mouth/Throat: Oropharynx is clear and moist.  Eyes: Pupils are equal, round, and reactive to light. EOM are normal.  Pulmonary/Chest: Effort normal.  Neurological: She is alert and oriented to person, place, and time.  Skin: Skin is warm and dry.  Psychiatric: She has a normal mood and affect. Her behavior is normal.    Ortho Exam  Exam today reveals a decreased range of motion of the right shoulder.  She has about 60 degrees of abduction 40 degrees to 50 degrees of forward flexion.  Internal and external rotation are very limited.  Pain with range of motion.  Neurovascular intact distally.  Specialty Comments:  No specialty comments available.  Imaging: Xr Shoulder Right  Result Date: 04/04/2018 4 view x-ray of the right shoulder reveals periarticular spurring more inferior.  She also has some superiorly on the femoral neck.  Certainly loss of joint space in the glenohumeral joint with sclerosing both of the glenoid as well as the humeral head.  Fairly large inferior spur of the humeral head inferiorly as well as of the glenoid.  Essentially bone-on-bone noted.    PMFS History: Patient Active Problem List   Diagnosis Date Noted  . Epistaxis, recurrent 09/07/2013  . Postop Hyponatremia 11/11/2011  . Postop Hypokalemia 11/11/2011  . OA (osteoarthritis) of hip 11/09/2011  . Preop cardiovascular exam 10/07/2011  . Chest pain 10/07/2011  . Mitral valve prolapse 10/07/2011  . Hypertension 10/07/2011   Past Medical History:  Diagnosis Date  . Allergy   . Anxiety   . Fibromyalgia   . GERD (gastroesophageal reflux disease)   . Glenoid labral tear   . Headache(784.0)    TAKES ATENOLOL  . Hypertension   . MVP (mitral valve prolapse)   . PONV (postoperative nausea and vomiting)    WITH [facility] DRIP/ no problems 9'13 with hip scope    Family  History  Problem Relation Age of Onset  . Heart disease Unknown        No family history of    Past Surgical History:  Procedure Laterality Date  . ABDOMINAL HYSTERECTOMY    . BREAST SURGERY     DUCTS REMOVED FROM RT BREAST 2008  . HIP ARTHROSCOPY     2010 L HIP ARTHROSCOPY   . HIP ARTHROSCOPY  04/20/2011   Procedure: ARTHROSCOPY HIP;  Surgeon: Gus Rankin Aluisio;  Location: [facility];  Service: Orthopedics;  Laterality: Left;  debridement labral tear, chondroplasty  . JOINT REPLACEMENT    . TOTAL HIP ARTHROPLASTY  11/09/2011   Procedure: TOTAL HIP ARTHROPLASTY;  Surgeon: Loanne Drilling, MD;  Location: [facility];  Service: Orthopedics;  Laterality: Left;  . TUBAL LIGATION     1986   Social History   Occupational History    Employer: [employer]    Comment: Nurse  Tobacco Use  .  Smoking status: Never Smoker  . Smokeless tobacco: Never Used  Substance and Sexual Activity  . Alcohol use: Yes    Alcohol/week: 7.0 standard drinks    Types: 7 Glasses of wine per week    Comment: 1-2 glasses of wine per day  . Drug use: No  . Sexual activity: Yes

## 2018-08-31 ENCOUNTER — Telehealth (INDEPENDENT_AMBULATORY_CARE_PROVIDER_SITE_OTHER): Payer: Self-pay | Admitting: Orthopedic Surgery

## 2018-08-31 ENCOUNTER — Other Ambulatory Visit (INDEPENDENT_AMBULATORY_CARE_PROVIDER_SITE_OTHER): Payer: Self-pay | Admitting: Orthopaedic Surgery

## 2018-08-31 DIAGNOSIS — M19011 Primary osteoarthritis, right shoulder: Secondary | ICD-10-CM

## 2018-08-31 NOTE — Telephone Encounter (Signed)
Dr Dorene Grebe

## 2018-08-31 NOTE — Telephone Encounter (Signed)
Please see below. I do not see mention of who you recommend for shoulder replacement in the last office note.

## 2018-08-31 NOTE — Telephone Encounter (Signed)
Patient called stating she had an appointment with Arlys John on 04/04/18 for right shoulder pain.  Patient states Arlys John told her Dr. Cleophas Dunker was no longer doing shoulder surgeries and gave her the name of a surgeon he recommended for replacement surgery.  Patient states she lost his name and requesting a return call.

## 2018-08-31 NOTE — Telephone Encounter (Signed)
Spoke with patient. I put in the referral to Dr.Dean.

## 2018-09-03 ENCOUNTER — Encounter (INDEPENDENT_AMBULATORY_CARE_PROVIDER_SITE_OTHER): Payer: Self-pay | Admitting: Orthopedic Surgery

## 2018-09-03 ENCOUNTER — Other Ambulatory Visit: Payer: Self-pay

## 2018-09-03 ENCOUNTER — Ambulatory Visit (INDEPENDENT_AMBULATORY_CARE_PROVIDER_SITE_OTHER): Payer: Managed Care, Other (non HMO) | Admitting: Orthopedic Surgery

## 2018-09-03 DIAGNOSIS — M19011 Primary osteoarthritis, right shoulder: Secondary | ICD-10-CM

## 2018-09-03 NOTE — Addendum Note (Signed)
Addended byPrescott Parma on: 09/03/2018 02:24 PM   Modules accepted: Orders

## 2018-09-03 NOTE — Progress Notes (Signed)
Office Visit Note   Patient: Brenda Short           Date of Birth: January 20, 1956           MRN: [id number] Visit Date: 09/03/2018 Requested by: Valeria Batman, MD [address] [place], [place] [address] PCP: Martha Clan, MD  Subjective: Chief Complaint  Patient presents with  . Right Shoulder - Follow-up    HPI: Brenda Short is a 63 year old patient with right shoulder pain.  Is been going on for about 7 years.  She works as a Brewing technologist.  This is not physical work but it does involve going to a lot of meetings.  She has night pain and rest pain with that right shoulder as well as pain that occurs on a daily basis.  Tried over-the-counter medication without much relief.  She states that using the mouse actually hurts her shoulder.  She is tried injections the last 1 of which only lasted about 24 to 48 hours.  She is also done therapy and injections and dry needling.  These have not given her sustained relief.              ROS: All systems reviewed are negative as they relate to the chief complaint within the history of present illness.  Patient denies  fevers or chills.   Assessment & Plan: Visit Diagnoses:  1. Primary osteoarthritis, right shoulder     Plan: Impression is right shoulder pain with significant osteoarthritis based on plain radiographs.  Rotator cuff intact by MRI scan 2 years ago as well as on exam today.  I discussed with Brenda Short the risk and benefits of shoulder replacement.  They include but not limited to good pain relief and increased function and corresponding risk include infection loosening and instability.  Component where also discussed.  Rationale for patient specific instrumentation also discussed.  Plan at this time is for thin cut CT scan with follow-up afterwards by telephone more than likely.  Looks like we will be able to get this done hopefully sometime so that she is recovered by the end of the summer.  Discussed with her the rehab as  well in terms of how long she would be out of work and when she can drive.  All questions answered  Follow-Up Instructions: No follow-ups on file.   Orders:  No orders of the defined types were placed in this encounter.  No orders of the defined types were placed in this encounter.     Procedures: No procedures performed   Clinical Data: No additional findings.  Objective: Vital Signs: There were no vitals taken for this visit.  Physical Exam:   Constitutional: Patient appears well-developed HEENT:  Head: Normocephalic Eyes:EOM are normal Neck: Normal range of motion Cardiovascular: Normal rate Pulmonary/chest: Effort normal Neurologic: Patient is alert Skin: Skin is warm Psychiatric: Patient has normal mood and affect    Ortho Exam: Ortho exam demonstrates external rotation of 15 degrees of abduction on the right to about 5 degrees.  Isolated glenohumeral forward flexion is about 90 and isolated glenohumeral abduction is about 60.  Rotator cuff strength is good infraspinatus supraspinatus and subscap testing.  Skin is intact in that right shoulder girdle region.  Left shoulder has excellent range of motion and excellent rotator cuff strength.  Specialty Comments:  No specialty comments available.  Imaging: No results found.   PMFS History: Patient Active Problem List   Diagnosis Date Noted  . Epistaxis, recurrent 09/07/2013  .  Postop Hyponatremia 11/11/2011  . Postop Hypokalemia 11/11/2011  . OA (osteoarthritis) of hip 11/09/2011  . Preop cardiovascular exam 10/07/2011  . Chest pain 10/07/2011  . Mitral valve prolapse 10/07/2011  . Hypertension 10/07/2011   Past Medical History:  Diagnosis Date  . Allergy   . Anxiety   . Fibromyalgia   . GERD (gastroesophageal reflux disease)   . Glenoid labral tear   . Headache(784.0)    TAKES ATENOLOL  . Hypertension   . MVP (mitral valve prolapse)   . PONV (postoperative nausea and vomiting)    WITH [facility]  DRIP/ no problems 9'13 with hip scope    Family History  Problem Relation Age of Onset  . Heart disease Unknown        No family history of    Past Surgical History:  Procedure Laterality Date  . ABDOMINAL HYSTERECTOMY    . BREAST SURGERY     DUCTS REMOVED FROM RT BREAST 2008  . HIP ARTHROSCOPY     2010 L HIP ARTHROSCOPY   . HIP ARTHROSCOPY  04/20/2011   Procedure: ARTHROSCOPY HIP;  Surgeon: Gus Rankin Aluisio;  Location: [facility];  Service: Orthopedics;  Laterality: Left;  debridement labral tear, chondroplasty  . JOINT REPLACEMENT    . TOTAL HIP ARTHROPLASTY  11/09/2011   Procedure: TOTAL HIP ARTHROPLASTY;  Surgeon: Loanne Drilling, MD;  Location: [facility];  Service: Orthopedics;  Laterality: Left;  . TUBAL LIGATION     1986   Social History   Occupational History    Employer: [employer]    Comment: Nurse  Tobacco Use  . Smoking status: Never Smoker  . Smokeless tobacco: Never Used  Substance and Sexual Activity  . Alcohol use: Yes    Alcohol/week: 7.0 standard drinks    Types: 7 Glasses of wine per week    Comment: 1-2 glasses of wine per day  . Drug use: No  . Sexual activity: Yes

## 2018-09-12 ENCOUNTER — Other Ambulatory Visit: Payer: Self-pay

## 2018-09-12 ENCOUNTER — Ambulatory Visit
Admission: RE | Admit: 2018-09-12 | Discharge: 2018-09-12 | Disposition: A | Discharge status: Home / Self Care | Payer: Medicare Other | Source: Ambulatory Visit | Attending: Orthopedic Surgery | Admitting: Orthopedic Surgery

## 2018-09-12 DIAGNOSIS — M19011 Primary osteoarthritis, right shoulder: Secondary | ICD-10-CM

## 2018-09-21 ENCOUNTER — Encounter (INDEPENDENT_AMBULATORY_CARE_PROVIDER_SITE_OTHER): Payer: Self-pay | Admitting: Orthopedic Surgery

## 2018-09-21 NOTE — Telephone Encounter (Signed)
Try otc first and if needed tramadol 1 po q12 # 35

## 2018-09-21 NOTE — Progress Notes (Signed)
Can you see if she wants f/u after ct scan that shows severe end stage oa - she was referred by p whitfield

## 2018-09-21 NOTE — Progress Notes (Signed)
Does she have a follow-up appointment?

## 2018-09-24 MED ORDER — TRAMADOL HCL 50 MG PO TABS: 50.0000 mg | ORAL_TABLET | Freq: Two times a day (BID) | ORAL | 0 refills | Status: DC | PRN

## 2018-09-24 NOTE — Telephone Encounter (Signed)
Y lets try tramadol

## 2018-10-30 ENCOUNTER — Encounter: Payer: Self-pay | Admitting: Orthopedic Surgery

## 2018-10-31 NOTE — Telephone Encounter (Signed)
We should be able to get it done in June or July.  I think by then it should be fine with no significant concerns and we can also do it as an outpatient which would minimize your time in the facility.  Doing the case at the surgery center also an option if you are not comfortable account health.

## 2018-11-14 ENCOUNTER — Encounter: Payer: Self-pay | Admitting: Orthopedic Surgery

## 2018-11-15 NOTE — Telephone Encounter (Signed)
I think we should aim for late July.  COVID is decreasing.  I like to waken his screening patients more than the surgery center.  At [facility] all patients actually are tested for COVID as opposed to the surgery center where they do not test patients but have them quarantine beforehand.  If it is agreeable to you I would aim for late July and we can always do that as an outpatient at [facility].

## 2018-11-19 NOTE — Telephone Encounter (Signed)
Done thx

## 2018-12-05 ENCOUNTER — Other Ambulatory Visit: Payer: Self-pay

## 2018-12-11 ENCOUNTER — Encounter: Payer: Self-pay | Admitting: Orthopedic Surgery

## 2018-12-17 NOTE — Pre-Procedure Instructions (Signed)
Brenda HiltsMargaret C Short  12/17/2018      [id number] [place], [place] - [address] [place] [id number] Phone: [phone] Fax: [phone]  [facility] [place] - [address] [place] [id number] Phone: [phone] Fax: [phone]    Your procedure is scheduled on Tues., December 25, 2018 from 7:15AM-11:06AM  [facility]" at 5:30AM  Call this number if you have problems the morning of surgery:  [phone]   Remember:  Do not eat after midnight on July 20th  You may drink clear liquids until 3 hours (4:15AM) prior to surgery time.  Clear liquids allowed are:  Water, Juice (non-citric and without pulp), Carbonated beverages, Clear Tea, Black Coffee only, Plain Jell-O only, Gatorade and Plain Popsicles only   Please complete your PRE-SURGERY ENSURE that was provided to you by (4:15AM) the morning of surgery.  Please, if able, drink it in one setting. DO NOT SIP.     Take these medicines the morning of surgery with A SIP OF WATER: Atenolol (TENORMIN), Gabapentin (NEURONTIN), and Pantoprazole (PROTONIX)  If needed: Acetaminophen (TYLENOL), Azelastine HCl (ASTEPRO),TraMADol (ULTRAM), and Albuterol Inhaler-bring with you the day of surgery   As of today, stop taking all Aspirin (unless instructed by your doctor) and Other Aspirin containing products, Vitamins, Fish oils, and Herbal medications. Also stop all NSAIDS i.e. Advil, Ibuprofen, Motrin, Aleve, Anaprox, Naproxen, BC, Goody Powders, and all Supplements.   Special instructions:  [place]- Preparing For Surgery  Before surgery, you can play an important role. Because skin is not sterile, your skin needs to be as free of germs as possible. You can reduce the number of germs on your skin by washing with CHG (chlorahexidine gluconate) Soap before surgery.  CHG is an antiseptic cleaner which  kills germs and bonds with the skin to continue killing germs even after washing.    Please do not use if you have an allergy to CHG or antibacterial soaps. If your skin becomes reddened/irritated stop using the CHG.  Do not shave (including legs and underarms) for at least 48 hours prior to first CHG shower. It is OK to shave your face.  Please follow these instructions carefully.   1. Shower the NIGHT BEFORE SURGERY and the MORNING OF SURGERY with CHG.   2. If you chose to wash your hair, wash your hair first as usual with your normal shampoo.  3. After you shampoo, rinse your hair and body thoroughly to remove the shampoo.  4. Use CHG as you would any other liquid soap. You can apply CHG directly to the skin and wash gently with a scrungie or a clean washcloth.   5. Apply the CHG Soap to your body ONLY FROM THE NECK DOWN.  Do not use on open wounds or open sores. Avoid contact with your eyes, ears, mouth and genitals (private parts). Wash Face and genitals (private parts)  with your normal soap.  6. Wash thoroughly, paying special attention to the area where your surgery will be performed.  7. Thoroughly rinse your body with warm water from the neck down.  8. DO NOT shower/wash with your normal soap after using and rinsing off the CHG Soap.  9. Pat yourself dry with a CLEAN TOWEL.  10. Wear CLEAN PAJAMAS to bed the night before surgery, wear comfortable clothes the morning of surgery  11.  Place CLEAN SHEETS on your bed the night of your first shower and DO NOT SLEEP WITH PETS.   Day of Surgery:  Oral Hygiene is also important to reduce your risk of infection.  Remember - BRUSH YOUR TEETH THE MORNING OF SURGERY WITH YOUR REGULAR TOOTHPASTE   Do not wear jewelry, make-up or nail polish.  Do not wear lotions, powders, or perfumes, or deodorant.  Do not shave 48 hours prior to surgery.    Do not bring valuables to the hospital.  [facility] is not responsible for any belongings  or valuables.  Contacts, dentures or bridgework may not be worn into surgery.    For patients admitted to the hospital, discharge time will be determined by your treatment team.  Patients discharged the day of surgery will not be allowed to drive home.   Please wear clean clothes to the hospital/surgery center.     Please read over the following fact sheets that you were given. Pain Booklet, Coughing and Deep Breathing, MRSA Information and Surgical Site Infection Prevention

## 2018-12-18 ENCOUNTER — Encounter (HOSPITAL_COMMUNITY): Payer: Self-pay

## 2018-12-18 ENCOUNTER — Other Ambulatory Visit: Payer: Self-pay

## 2018-12-18 ENCOUNTER — Other Ambulatory Visit: Payer: Self-pay | Admitting: Surgical

## 2018-12-18 ENCOUNTER — Encounter (HOSPITAL_COMMUNITY)
Admission: RE | Admit: 2018-12-18 | Discharge: 2018-12-18 | Disposition: A | Discharge status: Home / Self Care | Payer: Medicare Other | Source: Ambulatory Visit | Attending: Orthopedic Surgery | Admitting: Orthopedic Surgery

## 2018-12-18 DIAGNOSIS — R9431 Abnormal electrocardiogram [ECG] [EKG]: Secondary | ICD-10-CM | POA: Insufficient documentation

## 2018-12-18 DIAGNOSIS — Z01818 Encounter for other preprocedural examination: Secondary | ICD-10-CM | POA: Insufficient documentation

## 2018-12-18 HISTORY — DX: Unspecified osteoarthritis, unspecified site: M19.90

## 2018-12-18 LAB — CBC
HCT: 39.9 % (ref 36.0–46.0)
Hemoglobin: 13.1 g/dL (ref 12.0–15.0)
MCH: 30.8 pg (ref 26.0–34.0)
MCHC: 32.8 g/dL (ref 30.0–36.0)
MCV: 93.7 fL (ref 80.0–100.0)
Platelets: 316 10*3/uL (ref 150–400)
RBC: 4.26 MIL/uL (ref 3.87–5.11)
RDW: 12.9 % (ref 11.5–15.5)
WBC: 6.4 10*3/uL (ref 4.0–10.5)
nRBC: 0 % (ref 0.0–0.2)

## 2018-12-18 LAB — COMPREHENSIVE METABOLIC PANEL
ALT: 18 U/L (ref 0–44)
AST: 21 U/L (ref 15–41)
Albumin: 4.2 g/dL (ref 3.5–5.0)
Alkaline Phosphatase: 81 U/L (ref 38–126)
Anion gap: 11 (ref 5–15)
BUN: 20 mg/dL (ref 8–23)
CO2: 26 mmol/L (ref 22–32)
Calcium: 9.6 mg/dL (ref 8.9–10.3)
Chloride: 100 mmol/L (ref 98–111)
Creatinine, Ser: 1.03 mg/dL — ABNORMAL HIGH (ref 0.44–1.00)
GFR calc Af Amer: >60 mL/min (ref >60)
GFR calc non Af Amer: 57 mL/min — ABNORMAL LOW (ref >60)
Glucose, Bld: 95 mg/dL (ref 70–99)
Potassium: 4.2 mmol/L (ref 3.5–5.1)
Sodium: 137 mmol/L (ref 135–145)
Total Bilirubin: 1.2 mg/dL (ref 0.3–1.2)
Total Protein: 7 g/dL (ref 6.5–8.1)

## 2018-12-18 LAB — URINALYSIS, ROUTINE W REFLEX MICROSCOPIC
Bilirubin Urine: NEGATIVE
Glucose, UA: NEGATIVE mg/dL
Hgb urine dipstick: NEGATIVE
Ketones, ur: 5 mg/dL — AB
Nitrite: POSITIVE — AB
Protein, ur: NEGATIVE mg/dL
Specific Gravity, Urine: 1.023 (ref 1.005–1.030)
pH: 5 (ref 5.0–8.0)

## 2018-12-18 LAB — SURGICAL PCR SCREEN
MRSA, PCR: NEGATIVE
Staphylococcus aureus: POSITIVE — AB

## 2018-12-18 NOTE — Progress Notes (Signed)
Pt made aware of PCR result + Staph. [facility]

## 2018-12-18 NOTE — Progress Notes (Signed)
PCP - Dr. Brigitte Pulse, W.  Cardiologist - Denies  Chest x-ray - Denies  EKG - 12/18/2018  Stress Test - 10/21/11 (E)  ECHO - 10/14/11 (E)  Cardiac Cath - Denies  AICD-na PM-na LOOP-na  Sleep Study - Denies CPAP - None  LABS- 12/18/2018: CBC, CMP, UA, UC, PCR 12/22/2018: COVID  ASA- Denies  ERAS- Yes- 1 drink given   Anesthesia-No  Pt denies having chest pain, sob, or fever at this time. All instructions explained to the pt, with a verbal understanding of the material. Pt agrees to go over the instructions while at home for a better understanding. Pt also instructed to self quarantine after being tested for COVID-19. The opportunity to ask questions was provided.   Coronavirus Screening  Have you experienced the following symptoms:  Cough yes/no: No Fever (>100.24F)  yes/no: No Runny nose yes/no: No Sore throat yes/no: No Difficulty breathing/shortness of breath  yes/no: No  Have you or a family member traveled in the last 14 days and where? yes/no: No    If the patient indicates "YES" to the above questions, their PAT will be rescheduled to limit the exposure to others and, the surgeon will be notified. THE PATIENT WILL NEED TO BE ASYMPTOMATIC FOR 14 DAYS.   If the patient is not experiencing any of these symptoms, the PAT nurse will instruct them to NOT bring anyone with them to their appointment since they may have these symptoms or traveled as well.   Please remind your patients and families that hospital visitation restrictions are in effect and the importance of the restrictions.

## 2018-12-19 ENCOUNTER — Other Ambulatory Visit: Payer: Self-pay | Admitting: Surgical

## 2018-12-19 MED ORDER — SULFAMETHOXAZOLE-TRIMETHOPRIM 800-160 MG PO TABS: 1.0000 | ORAL_TABLET | Freq: Two times a day (BID) | ORAL | 0 refills | Status: AC

## 2018-12-19 NOTE — Anesthesia Preprocedure Evaluation (Addendum)
Anesthesia Evaluation  Patient identified by MRN, date of birth, ID band Patient awake    Reviewed: Allergy & Precautions, NPO status , Patient's Chart, lab work & pertinent test results  Airway Mallampati: II  TM Distance: >3 FB Neck ROM: Full    Dental  (+) Teeth Intact, Dental Advisory Given   Pulmonary    breath sounds clear to auscultation       Cardiovascular hypertension,  Rhythm:Regular Rate:Normal     Neuro/Psych    GI/Hepatic   Endo/Other    Renal/GU      Musculoskeletal   Abdominal   Peds  Hematology   Anesthesia Other Findings   Reproductive/Obstetrics                            Anesthesia Physical Anesthesia Plan  ASA: II  Anesthesia Plan: General   Post-op Pain Management:  Regional for Post-op pain   Induction:   PONV Risk Score and Plan: Ondansetron and Dexamethasone  Airway Management Planned: Oral ETT  Additional Equipment:   Intra-op Plan:   Post-operative Plan:   Informed Consent: I have reviewed the patients History and Physical, chart, labs and discussed the procedure including the risks, benefits and alternatives for the proposed anesthesia with the patient or authorized representative who has indicated his/her understanding and acceptance.     Dental advisory given  Plan Discussed with: CRNA and Anesthesiologist  Anesthesia Plan Comments: (PAT note written 12/19/2018 by Myra Gianotti, PA-C. )       Anesthesia Quick Evaluation

## 2018-12-19 NOTE — Progress Notes (Addendum)
Anesthesia Chart Review:  Case: [id number] Date/Time: 12/25/18 0700   Procedure: RIGHT TOTAL SHOULDER ARTHROPLASTY (Right )   Anesthesia type: General   Pre-op diagnosis: right shoulder osteoarthritis   Location: [facility] OR ROOM 06 / [place] [facility]   Surgeon: Meredith Pel, MD      DISCUSSION: Patient is a 63 year old female scheduled for the above procedure.  History includes never smoker, post-operative N/V, MVP (no MVP seen on 10/2011 echo), HTN, GERD, anxiety, fibromyalgia, headaches (on atenolol for prophylaxis).   Urine culture positive for E. coli.  Result called to [place] at Dr. Randel Short office, but it appears she has already been prescribed Bactrim-DS on 12/19/18.   Presurgical COVID-19 test is scheduled for 12/22/2018.  If results negative and otherwise no acute changes and I would anticipate that she could proceed as planned.   VS: BP 117/73   Pulse 70   Temp 36.9 C   Resp 20   Ht 5\' 10"  (1.778 m)   Wt 78.7 kg   SpO2 97%   BMI 24.88 kg/m    PROVIDERS: Marton Redwood, MD is PCP -She is not routinely followed by cardiology.  She was seen by Claris Pong, MD on 10/07/11 for preoperative evaluation for THR due to history of MVP and history occasional "ache" in her chest with anxiety. Stress echo was negative. Echo did not show evidence of MVP. PRN follow-up recommended.    LABS: Preoperative labs reviewed. Urine culture grew >=100,000 COLONIES/mL ESCHERICHIA COLI SUSCEPTIBILITIES TO FOLLOW. (all labs ordered are listed, but only abnormal results are displayed)  Labs Reviewed  URINE CULTURE - Abnormal; Notable for the following components:      Result Value   Culture   (*)    Value: >=100,000 COLONIES/mL ESCHERICHIA COLI SUSCEPTIBILITIES TO FOLLOW Performed at [facility], [place] [address] [place], [place] [address]    All other components within normal limits  SURGICAL PCR SCREEN - Abnormal; Notable for the following components:   Staphylococcus aureus POSITIVE (*)     All other components within normal limits  URINALYSIS, ROUTINE W REFLEX MICROSCOPIC - Abnormal; Notable for the following components:   Color, Urine AMBER (*)    APPearance HAZY (*)    Ketones, ur 5 (*)    Nitrite POSITIVE (*)    Leukocytes,Ua MODERATE (*)    Bacteria, UA MANY (*)    All other components within normal limits  COMPREHENSIVE METABOLIC PANEL - Abnormal; Notable for the following components:   Creatinine, Ser 1.03 (*)    GFR calc non Af Amer 57 (*)    All other components within normal limits  CBC     IMAGES: CT right shoulder 09/12/18: IMPRESSION: 1. Severe osteoarthritis of the right glenohumeral joint with prominent spurring, subcortical eburnation, full-thickness loss of articular cartilage thickness, flattening of the glenoid, and slight subsidence of the articular portion of the humeral head. 2. Suspected thinning of the supraspinatus tendon. No rotator cuff muscular atrophy. Sensitivity for full-thickness partial width tears of the rotator cuff is low on non arthrogram CT. 3. Mesoacromial os acromiale.   EKG: 12/17/18: Normal sinus rhythm Left axis deviation Nonspecific T wave abnormality Abnormal ECG Confirmed by Brenda Short [phone]) on 12/18/2018 8:28:11 PM - She has flat or negative T waves in V1-4 and III, aVF which were present on 10/07/11. Negative stress echo at that time.   CV: Echo 10/14/11: Study Conclusions  - Left ventricle: The cavity size was normal. Wall thickness  was normal. Systolic function  was normal. The estimated  ejection fraction was in the range of 60% to 65%. Wall  motion was normal; there were no regional wall motion  abnormalities. Left ventricular diastolic function  parameters were normal.  - Atrial septum: No defect or patent foramen ovale was  identified.  - Mitral valve: Structurally normal valve. Leaflet  separation was normal. No echocardiographic evidence for  prolapse. Doppler: Transvalvular velocity was within  the  normal range. There was no evidence for stenosis. No  regurgitation.  - Right ventricle: The cavity size was normal. Wall thickness  was normal. Systolic function was normal.  - Tricuspid valve: Mild regurgitation.  - Pulmonary artery: The main pulmonary artery was  normal-sized. Systolic pressure was within the normal range.   Dobutamine Stress Echo 10/14/11: Study Conclusions  - Stress ECG conclusions: The stress ECG was normal.  - Staged echo: Normal echo stress    Past Medical History:  Diagnosis Date  . Allergy   . Anxiety   . Arthritis   . Fibromyalgia   . GERD (gastroesophageal reflux disease)   . Glenoid labral tear   . Headache(784.0)    TAKES ATENOLOL  . Hypertension   . MVP (mitral valve prolapse)   . PONV (postoperative nausea and vomiting)    [facility] no problems 9'13 with hip scope    Past Surgical History:  Procedure Laterality Date  . ABDOMINAL HYSTERECTOMY    . BREAST SURGERY     DUCTS REMOVED FROM RT BREAST 2008  . HIP ARTHROSCOPY     2010 L HIP ARTHROSCOPY   . HIP ARTHROSCOPY  04/20/2011   Procedure: ARTHROSCOPY HIP;  Surgeon: Gus RankinFrank V Aluisio;  Location: [facility];  Service: Orthopedics;  Laterality: Left;  debridement labral tear, chondroplasty  . JOINT REPLACEMENT    . TOTAL HIP ARTHROPLASTY  11/09/2011   Procedure: TOTAL HIP ARTHROPLASTY;  Surgeon: Loanne DrillingFrank V Aluisio, MD;  Location: [facility];  Service: Orthopedics;  Laterality: Left;  . TUBAL LIGATION     1986    MEDICATIONS: . acetaminophen (TYLENOL) 500 MG tablet  . albuterol (PROVENTIL HFA;VENTOLIN HFA) 108 (90 BASE) MCG/ACT inhaler  . amitriptyline (ELAVIL) 25 MG tablet  . atenolol (TENORMIN) 25 MG tablet  . Azelastine HCl (ASTEPRO) 0.15 % SOLN  . docusate sodium (COLACE) 100 MG capsule  . gabapentin (NEURONTIN) 300 MG capsule  . naproxen sodium (ALEVE) 220 MG tablet  . pantoprazole (PROTONIX) 40 MG tablet  . PARoxetine (PAXIL) 20 MG tablet  . polyethylene glycol powder  (MIRALAX) powder  . pramipexole (MIRAPEX) 1 MG tablet  . rizatriptan (MAXALT) 10 MG tablet  . sulfamethoxazole-trimethoprim (BACTRIM DS) 800-160 MG tablet  . telmisartan-hydrochlorothiazide (MICARDIS HCT) 80-25 MG tablet  . traMADol (ULTRAM) 50 MG tablet  . triamcinolone (NASACORT) 55 MCG/ACT nasal inhaler  . trolamine salicylate (ASPERCREME) 10 % cream   No current facility-administered medications for this encounter.     Shonna ChockAllison Skipper Dacosta, PA-C Surgical Short Stay/Anesthesiology [facility] Phone [phone] [facility] Phone [phone] 12/19/2018 1:04 PM

## 2018-12-21 LAB — URINE CULTURE: Culture: >=100000 — AB

## 2018-12-22 ENCOUNTER — Other Ambulatory Visit (HOSPITAL_COMMUNITY)
Admission: RE | Admit: 2018-12-22 | Discharge: 2018-12-22 | Disposition: A | Discharge status: Home / Self Care | Payer: Medicare Other | Source: Ambulatory Visit | Attending: Orthopedic Surgery | Admitting: Orthopedic Surgery

## 2018-12-22 DIAGNOSIS — Z1159 Encounter for screening for other viral diseases: Secondary | ICD-10-CM | POA: Insufficient documentation

## 2018-12-22 LAB — SARS CORONAVIRUS 2 (TAT 6-24 HRS): SARS Coronavirus 2: NEGATIVE

## 2018-12-25 ENCOUNTER — Encounter (HOSPITAL_COMMUNITY): Payer: Self-pay

## 2018-12-25 ENCOUNTER — Inpatient Hospital Stay (HOSPITAL_COMMUNITY): Payer: Medicare Other

## 2018-12-25 ENCOUNTER — Inpatient Hospital Stay (HOSPITAL_COMMUNITY)
Admission: RE | Admit: 2018-12-25 | Discharge: 2018-12-26 | DRG: 483 | Disposition: A | Discharge status: Home Health | Payer: Medicare Other | Attending: Orthopedic Surgery | Admitting: Orthopedic Surgery

## 2018-12-25 ENCOUNTER — Inpatient Hospital Stay (HOSPITAL_COMMUNITY): Payer: Medicare Other | Admitting: Vascular Surgery

## 2018-12-25 ENCOUNTER — Encounter (HOSPITAL_COMMUNITY)
Admission: RE | Disposition: A | Discharge status: Home Health | Payer: Self-pay | Source: Home / Self Care | Attending: Orthopedic Surgery

## 2018-12-25 ENCOUNTER — Inpatient Hospital Stay (HOSPITAL_COMMUNITY): Payer: Medicare Other | Admitting: Anesthesiology

## 2018-12-25 ENCOUNTER — Other Ambulatory Visit: Payer: Self-pay

## 2018-12-25 DIAGNOSIS — Z96642 Presence of left artificial hip joint: Secondary | ICD-10-CM | POA: Diagnosis present

## 2018-12-25 DIAGNOSIS — M19011 Primary osteoarthritis, right shoulder: Principal | ICD-10-CM | POA: Diagnosis present

## 2018-12-25 DIAGNOSIS — I10 Essential (primary) hypertension: Secondary | ICD-10-CM | POA: Diagnosis present

## 2018-12-25 DIAGNOSIS — M19019 Primary osteoarthritis, unspecified shoulder: Secondary | ICD-10-CM | POA: Diagnosis present

## 2018-12-25 HISTORY — PX: TOTAL SHOULDER ARTHROPLASTY: SHX126

## 2018-12-25 SURGERY — ARTHROPLASTY, SHOULDER, TOTAL
Anesthesia: General | Site: Shoulder | Laterality: Right

## 2018-12-25 MED ORDER — ONDANSETRON HCL 4 MG/2ML IJ SOLN
INTRAMUSCULAR | Status: DC | PRN
  Administered 2018-12-25: 4 mg via INTRAVENOUS

## 2018-12-25 MED ORDER — PRAMIPEXOLE DIHYDROCHLORIDE 1 MG PO TABS: 1.0000 mg | ORAL_TABLET | Freq: Every day | ORAL | Status: DC

## 2018-12-25 MED ORDER — LIDOCAINE 2% (20 MG/ML) 5 ML SYRINGE
INTRAMUSCULAR | Status: AC
  Filled 2018-12-25: qty 5

## 2018-12-25 MED ORDER — EPHEDRINE SULFATE-NACL 50-0.9 MG/10ML-% IV SOSY
PREFILLED_SYRINGE | INTRAVENOUS | Status: DC | PRN
  Administered 2018-12-25: 5 mg via INTRAVENOUS
  Administered 2018-12-25 (×2): 10 mg via INTRAVENOUS

## 2018-12-25 MED ORDER — PROPOFOL 10 MG/ML IV BOLUS
INTRAVENOUS | Status: DC | PRN
  Administered 2018-12-25: 170 mg via INTRAVENOUS

## 2018-12-25 MED ORDER — FENTANYL CITRATE (PF) 250 MCG/5ML IJ SOLN
INTRAMUSCULAR | Status: DC | PRN
  Administered 2018-12-25 (×2): 50 ug via INTRAVENOUS

## 2018-12-25 MED ORDER — ONDANSETRON HCL 4 MG/2ML IJ SOLN: 4.0000 mg | Freq: Four times a day (QID) | INTRAMUSCULAR | Status: DC | PRN

## 2018-12-25 MED ORDER — MIDAZOLAM HCL 2 MG/2ML IJ SOLN
INTRAMUSCULAR | Status: AC
  Filled 2018-12-25: qty 2

## 2018-12-25 MED ORDER — VANCOMYCIN HCL 1000 MG IV SOLR
INTRAVENOUS | Status: DC | PRN
  Administered 2018-12-25: 1000 mg

## 2018-12-25 MED ORDER — PRAMIPEXOLE DIHYDROCHLORIDE 1 MG PO TABS
2.0000 mg | ORAL_TABLET | Freq: Every day | ORAL | Status: DC
  Administered 2018-12-25: 2 mg via ORAL
  Filled 2018-12-25 (×2): qty 2

## 2018-12-25 MED ORDER — CEFAZOLIN SODIUM-DEXTROSE 1-4 GM/50ML-% IV SOLN
1.0000 g | Freq: Four times a day (QID) | INTRAVENOUS | Status: AC
  Administered 2018-12-25 (×2): 1 g via INTRAVENOUS
  Filled 2018-12-25 (×2): qty 50

## 2018-12-25 MED ORDER — GLYCOPYRROLATE 0.2 MG/ML IJ SOLN
INTRAMUSCULAR | Status: DC | PRN
  Administered 2018-12-25: 0.1 mg via INTRAVENOUS

## 2018-12-25 MED ORDER — HYDROMORPHONE HCL 1 MG/ML IJ SOLN: 0.5000 mg | INTRAMUSCULAR | Status: DC | PRN

## 2018-12-25 MED ORDER — ONDANSETRON HCL 4 MG/2ML IJ SOLN: 4.0000 mg | Freq: Once | INTRAMUSCULAR | Status: DC | PRN

## 2018-12-25 MED ORDER — SUCCINYLCHOLINE CHLORIDE 200 MG/10ML IV SOSY
PREFILLED_SYRINGE | INTRAVENOUS | Status: AC
  Filled 2018-12-25: qty 10

## 2018-12-25 MED ORDER — BUPIVACAINE-EPINEPHRINE (PF) 0.5% -1:200000 IJ SOLN
INTRAMUSCULAR | Status: AC
  Filled 2018-12-25: qty 30

## 2018-12-25 MED ORDER — PHENYLEPHRINE 40 MCG/ML (10ML) SYRINGE FOR IV PUSH (FOR BLOOD PRESSURE SUPPORT)
PREFILLED_SYRINGE | INTRAVENOUS | Status: DC | PRN
  Administered 2018-12-25: 120 ug via INTRAVENOUS
  Administered 2018-12-25: 80 ug via INTRAVENOUS
  Administered 2018-12-25: 120 ug via INTRAVENOUS
  Administered 2018-12-25: 80 ug via INTRAVENOUS

## 2018-12-25 MED ORDER — ROCURONIUM BROMIDE 10 MG/ML (PF) SYRINGE
PREFILLED_SYRINGE | INTRAVENOUS | Status: DC | PRN
  Administered 2018-12-25: 50 mg via INTRAVENOUS

## 2018-12-25 MED ORDER — METOCLOPRAMIDE HCL 5 MG PO TABS: 5.0000 mg | ORAL_TABLET | Freq: Three times a day (TID) | ORAL | Status: DC | PRN

## 2018-12-25 MED ORDER — OXYCODONE HCL 5 MG PO TABS
5.0000 mg | ORAL_TABLET | ORAL | Status: DC | PRN
  Administered 2018-12-26: 5 mg via ORAL
  Filled 2018-12-25: qty 1

## 2018-12-25 MED ORDER — ALBUMIN HUMAN 5 % IV SOLN
12.5000 g | Freq: Once | INTRAVENOUS | Status: AC
  Administered 2018-12-25: 13:00:00 12.5 g via INTRAVENOUS

## 2018-12-25 MED ORDER — CHLORHEXIDINE GLUCONATE 4 % EX LIQD: 60.0000 mL | Freq: Once | CUTANEOUS | Status: DC

## 2018-12-25 MED ORDER — ONDANSETRON HCL 4 MG PO TABS: 4.0000 mg | ORAL_TABLET | Freq: Four times a day (QID) | ORAL | Status: DC | PRN

## 2018-12-25 MED ORDER — LACTATED RINGERS IV SOLN
INTRAVENOUS | Status: DC | PRN
  Administered 2018-12-25: 07:00:00 via INTRAVENOUS

## 2018-12-25 MED ORDER — EPHEDRINE 5 MG/ML INJ
INTRAVENOUS | Status: AC
  Filled 2018-12-25: qty 10

## 2018-12-25 MED ORDER — ROCURONIUM BROMIDE 10 MG/ML (PF) SYRINGE
PREFILLED_SYRINGE | INTRAVENOUS | Status: AC
  Filled 2018-12-25: qty 10

## 2018-12-25 MED ORDER — DEXAMETHASONE SODIUM PHOSPHATE 10 MG/ML IJ SOLN
INTRAMUSCULAR | Status: DC | PRN
  Administered 2018-12-25: 10 mg via INTRAVENOUS

## 2018-12-25 MED ORDER — ACETAMINOPHEN 325 MG PO TABS: 325.0000 mg | ORAL_TABLET | Freq: Four times a day (QID) | ORAL | Status: DC | PRN

## 2018-12-25 MED ORDER — CEFAZOLIN SODIUM-DEXTROSE 2-4 GM/100ML-% IV SOLN
2.0000 g | INTRAVENOUS | Status: AC
  Administered 2018-12-25: 2 g via INTRAVENOUS
  Filled 2018-12-25: qty 100

## 2018-12-25 MED ORDER — PHENOL 1.4 % MT LIQD: 1.0000 | OROMUCOSAL | Status: DC | PRN

## 2018-12-25 MED ORDER — DEXAMETHASONE SODIUM PHOSPHATE 10 MG/ML IJ SOLN
INTRAMUSCULAR | Status: AC
  Filled 2018-12-25: qty 1

## 2018-12-25 MED ORDER — ACETAMINOPHEN 325 MG PO TABS
325.0000 mg | ORAL_TABLET | Freq: Four times a day (QID) | ORAL | Status: DC | PRN
  Administered 2018-12-25 – 2018-12-26 (×2): 650 mg via ORAL
  Filled 2018-12-25 (×2): qty 2

## 2018-12-25 MED ORDER — EPINEPHRINE PF 1 MG/ML IJ SOLN
INTRAMUSCULAR | Status: AC
  Filled 2018-12-25: qty 1

## 2018-12-25 MED ORDER — DOCUSATE SODIUM 100 MG PO CAPS
100.0000 mg | ORAL_CAPSULE | Freq: Two times a day (BID) | ORAL | Status: DC
  Administered 2018-12-25 – 2018-12-26 (×2): 100 mg via ORAL
  Filled 2018-12-25 (×2): qty 1

## 2018-12-25 MED ORDER — FENTANYL CITRATE (PF) 250 MCG/5ML IJ SOLN
INTRAMUSCULAR | Status: AC
  Filled 2018-12-25: qty 5

## 2018-12-25 MED ORDER — MENTHOL 3 MG MT LOZG: 1.0000 | LOZENGE | OROMUCOSAL | Status: DC | PRN

## 2018-12-25 MED ORDER — SUGAMMADEX SODIUM 200 MG/2ML IV SOLN
INTRAVENOUS | Status: DC | PRN
  Administered 2018-12-25: 80 mg via INTRAVENOUS

## 2018-12-25 MED ORDER — LIDOCAINE 2% (20 MG/ML) 5 ML SYRINGE
INTRAMUSCULAR | Status: DC | PRN
  Administered 2018-12-25: 40 mg via INTRAVENOUS

## 2018-12-25 MED ORDER — LACTATED RINGERS IV BOLUS
250.0000 mL | Freq: Once | INTRAVENOUS | Status: AC
  Administered 2018-12-25: 250 mL via INTRAVENOUS

## 2018-12-25 MED ORDER — ONDANSETRON HCL 4 MG/2ML IJ SOLN
INTRAMUSCULAR | Status: AC
  Filled 2018-12-25: qty 2

## 2018-12-25 MED ORDER — VANCOMYCIN HCL 1000 MG IV SOLR
INTRAVENOUS | Status: AC
  Filled 2018-12-25: qty 1000

## 2018-12-25 MED ORDER — CELECOXIB 200 MG PO CAPS
200.0000 mg | ORAL_CAPSULE | Freq: Two times a day (BID) | ORAL | Status: DC
  Administered 2018-12-25 – 2018-12-26 (×3): 200 mg via ORAL
  Filled 2018-12-25 (×3): qty 1

## 2018-12-25 MED ORDER — FENTANYL CITRATE (PF) 100 MCG/2ML IJ SOLN: 25.0000 ug | INTRAMUSCULAR | Status: DC | PRN

## 2018-12-25 MED ORDER — PROPOFOL 10 MG/ML IV BOLUS
INTRAVENOUS | Status: AC
  Filled 2018-12-25: qty 20

## 2018-12-25 MED ORDER — PNEUMOCOCCAL VAC POLYVALENT 25 MCG/0.5ML IJ INJ: 0.5000 mL | INJECTION | INTRAMUSCULAR | Status: DC

## 2018-12-25 MED ORDER — ALBUMIN HUMAN 5 % IV SOLN
INTRAVENOUS | Status: DC | PRN
  Administered 2018-12-25 (×2): via INTRAVENOUS

## 2018-12-25 MED ORDER — SODIUM CHLORIDE 0.9 % IV SOLN
INTRAVENOUS | Status: DC | PRN
  Administered 2018-12-25: 08:00:00 60 ug/min via INTRAVENOUS

## 2018-12-25 MED ORDER — METOCLOPRAMIDE HCL 5 MG/ML IJ SOLN: 5.0000 mg | Freq: Three times a day (TID) | INTRAMUSCULAR | Status: DC | PRN

## 2018-12-25 MED ORDER — 0.9 % SODIUM CHLORIDE (POUR BTL) OPTIME
TOPICAL | Status: DC | PRN
  Administered 2018-12-25 (×3): 1000 mL

## 2018-12-25 MED ORDER — MIDAZOLAM HCL 2 MG/2ML IJ SOLN
INTRAMUSCULAR | Status: DC | PRN
  Administered 2018-12-25: 2 mg via INTRAVENOUS

## 2018-12-25 MED ORDER — TRANEXAMIC ACID-NACL 1000-0.7 MG/100ML-% IV SOLN
1000.0000 mg | INTRAVENOUS | Status: AC
  Administered 2018-12-25: 1000 mg via INTRAVENOUS
  Filled 2018-12-25: qty 100

## 2018-12-25 MED ORDER — METHOCARBAMOL 500 MG PO TABS
500.0000 mg | ORAL_TABLET | Freq: Four times a day (QID) | ORAL | Status: DC | PRN
  Administered 2018-12-26: 500 mg via ORAL
  Filled 2018-12-25: qty 1

## 2018-12-25 MED ORDER — METHOCARBAMOL 1000 MG/10ML IJ SOLN
500.0000 mg | Freq: Four times a day (QID) | INTRAVENOUS | Status: DC | PRN
  Filled 2018-12-25: qty 5

## 2018-12-25 MED ORDER — ALBUMIN HUMAN 5 % IV SOLN
INTRAVENOUS | Status: AC
  Administered 2018-12-25: 12.5 g via INTRAVENOUS
  Filled 2018-12-25: qty 250

## 2018-12-25 MED ORDER — LACTATED RINGERS IV SOLN
INTRAVENOUS | Status: AC
  Administered 2018-12-25: 21:00:00 via INTRAVENOUS

## 2018-12-25 MED ORDER — PHENYLEPHRINE 40 MCG/ML (10ML) SYRINGE FOR IV PUSH (FOR BLOOD PRESSURE SUPPORT)
PREFILLED_SYRINGE | INTRAVENOUS | Status: AC
  Filled 2018-12-25: qty 10

## 2018-12-25 SURGICAL SUPPLY — 101 items
AID PSTN UNV HD RSTRNT DISP (MISCELLANEOUS) ×1
ALCOHOL 70% 16 OZ (MISCELLANEOUS) ×3 IMPLANT
APL PRP STRL LF DISP 70% ISPRP (MISCELLANEOUS) ×2
BEARING HUMERAL SHLDER 36M STD (Shoulder) IMPLANT
BIT DRILL 2.7 W/STOP DISP (BIT) ×1 IMPLANT
BIT DRILL 2.7MM W/STOP DISP (BIT) ×1
BIT DRILL TWIST 2.7 (BIT) ×1 IMPLANT
BIT DRILL TWIST 2.7MM (BIT) ×1
BLADE SAW SGTL 13X75X1.27 (BLADE) ×3 IMPLANT
BRNG HUM STD 36 RVRS SHLDR (Shoulder) ×1 IMPLANT
BSPLAT GLND LRG AUG TPR ADPR (Joint) ×1 IMPLANT
CHLORAPREP W/TINT 26 (MISCELLANEOUS) ×6 IMPLANT
CLOSURE STERI-STRIP 1/2X4 (GAUZE/BANDAGES/DRESSINGS) ×2
CLOSURE WOUND 1/2 X4 (GAUZE/BANDAGES/DRESSINGS) ×1
CLSR STERI-STRIP ANTIMIC 1/2X4 (GAUZE/BANDAGES/DRESSINGS) ×2 IMPLANT
COMP REV AUG LG W/TAPER/GLENOI (Joint) ×3 IMPLANT
COMPONENT RV AUG LG W/TAPR/GLN (Joint) IMPLANT
COVER SURGICAL LIGHT HANDLE (MISCELLANEOUS) ×3 IMPLANT
COVER WAND RF STERILE (DRAPES) ×3 IMPLANT
DRAPE INCISE IOBAN 66X45 STRL (DRAPES) ×3 IMPLANT
DRAPE ORTHO SPLIT 87X125 STRL (DRAPES) ×2 IMPLANT
DRAPE U-SHAPE 47X51 STRL (DRAPES) ×6 IMPLANT
DRSG AQUACEL AG ADV 3.5X10 (GAUZE/BANDAGES/DRESSINGS) ×3 IMPLANT
ELECT BLADE 4.0 EZ CLEAN MEGAD (MISCELLANEOUS)
ELECT REM PT RETURN 9FT ADLT (ELECTROSURGICAL) ×3
ELECTRODE BLDE 4.0 EZ CLN MEGD (MISCELLANEOUS) IMPLANT
ELECTRODE REM PT RTRN 9FT ADLT (ELECTROSURGICAL) ×1 IMPLANT
GAUZE SPONGE 4X4 12PLY STRL LF (GAUZE/BANDAGES/DRESSINGS) ×3 IMPLANT
GLENOID SPHERE 36MM CVD +3 (Orthopedic Implant) ×2 IMPLANT
GLOVE BIO SURGEON STRL SZ 6.5 (GLOVE) ×1 IMPLANT
GLOVE BIO SURGEONS STRL SZ 6.5 (GLOVE) ×1
GLOVE BIOGEL PI IND STRL 7.5 (GLOVE) ×1 IMPLANT
GLOVE BIOGEL PI IND STRL 8 (GLOVE) ×1 IMPLANT
GLOVE BIOGEL PI INDICATOR 7.5 (GLOVE) ×2
GLOVE BIOGEL PI INDICATOR 8 (GLOVE) ×2
GLOVE ECLIPSE 7.0 STRL STRAW (GLOVE) ×3 IMPLANT
GLOVE INDICATOR 7.0 STRL GRN (GLOVE) ×2 IMPLANT
GLOVE SURG ORTHO 8.0 STRL STRW (GLOVE) ×5 IMPLANT
GLOVE SURG SS PI 6.5 STRL IVOR (GLOVE) ×2 IMPLANT
GOWN STRL REUS W/ TWL LRG LVL3 (GOWN DISPOSABLE) ×2 IMPLANT
GOWN STRL REUS W/ TWL XL LVL3 (GOWN DISPOSABLE) ×1 IMPLANT
GOWN STRL REUS W/TWL LRG LVL3 (GOWN DISPOSABLE) ×6
GOWN STRL REUS W/TWL XL LVL3 (GOWN DISPOSABLE) ×3
GUIDE MODEL REV SHLD RT (ORTHOPEDIC DISPOSABLE SUPPLIES) ×2 IMPLANT
HYDROGEN PEROXIDE 16OZ (MISCELLANEOUS) ×3 IMPLANT
JET LAVAGE IRRISEPT WOUND (IRRIGATION / IRRIGATOR) ×3
KIT BASIN OR (CUSTOM PROCEDURE TRAY) ×3 IMPLANT
KIT TURNOVER KIT B (KITS) ×3 IMPLANT
LAVAGE JET IRRISEPT WOUND (IRRIGATION / IRRIGATOR) IMPLANT
LOOP VESSEL MAXI BLUE (MISCELLANEOUS) ×2 IMPLANT
MANIFOLD NEPTUNE II (INSTRUMENTS) ×3 IMPLANT
NDL HYPO 25GX1X1/2 BEV (NEEDLE) IMPLANT
NDL SUT 6 .5 CRC .975X.05 MAYO (NEEDLE) ×1 IMPLANT
NEEDLE HYPO 25GX1X1/2 BEV (NEEDLE) IMPLANT
NEEDLE MAYO TAPER (NEEDLE) ×3
NS IRRIG 1000ML POUR BTL (IV SOLUTION) ×3 IMPLANT
PACK SHOULDER (CUSTOM PROCEDURE TRAY) ×3 IMPLANT
PAD ARMBOARD 7.5X6 YLW CONV (MISCELLANEOUS) ×6 IMPLANT
PIN HUMERAL STMN 3.2MMX9IN (INSTRUMENTS) ×2 IMPLANT
PIN STEINMANN THREADED TIP (PIN) ×2 IMPLANT
PIN THREADED REVERSE (PIN) ×2 IMPLANT
REAMER GUIDE BUSHING SURG DISP (MISCELLANEOUS) ×2 IMPLANT
REAMER GUIDE W/SCREW AUG (MISCELLANEOUS) ×2 IMPLANT
RESTRAINT HEAD UNIVERSAL NS (MISCELLANEOUS) ×3 IMPLANT
RETRIEVER SUT HEWSON (MISCELLANEOUS) ×3 IMPLANT
SCREW BONE LOCKING 4.75X30X3.5 (Screw) ×2 IMPLANT
SCREW BONE LOCKING 4.75X35X3.5 (Screw) ×2 IMPLANT
SCREW BONE STRL 6.5MMX30MM (Screw) ×2 IMPLANT
SCREW LOCKING 4.75MMX15MM (Screw) ×2 IMPLANT
SCREW NON-LOCK 4.75MMX15MM (Screw) ×2 IMPLANT
SHOULDER HUMERAL BEAR 36M STD (Shoulder) ×3 IMPLANT
SLING ARM IMMOBILIZER LRG (SOFTGOODS) ×1 IMPLANT
SOL PREP POV-IOD 4OZ 10% (MISCELLANEOUS) ×3 IMPLANT
SPONGE LAP 18X18 RF (DISPOSABLE) ×3 IMPLANT
STEM HUMERAL STRL 12MMX83MM (Stem) ×2 IMPLANT
STRIP CLOSURE SKIN 1/2X4 (GAUZE/BANDAGES/DRESSINGS) ×2 IMPLANT
SUCTION FRAZIER HANDLE 10FR (MISCELLANEOUS) ×2
SUCTION TUBE FRAZIER 10FR DISP (MISCELLANEOUS) ×1 IMPLANT
SUT BROADBAND TAPE 2PK 1.5 (SUTURE) ×4 IMPLANT
SUT FIBERWIRE #2 38 T-5 BLUE (SUTURE)
SUT MAXBRAID (SUTURE) IMPLANT
SUT MNCRL AB 3-0 PS2 18 (SUTURE) ×3 IMPLANT
SUT VIC AB 0 CT1 27 (SUTURE) ×6
SUT VIC AB 0 CT1 27XBRD ANBCTR (SUTURE) ×2 IMPLANT
SUT VIC AB 1 CT1 27 (SUTURE) ×3
SUT VIC AB 1 CT1 27XBRD ANBCTR (SUTURE) ×1 IMPLANT
SUT VIC AB 2-0 CT1 27 (SUTURE) ×6
SUT VIC AB 2-0 CT1 TAPERPNT 27 (SUTURE) ×2 IMPLANT
SUT VIC AB 2-0 UR6 27 (SUTURE) ×4 IMPLANT
SUT VICRYL 0 UR6 27IN ABS (SUTURE) ×10 IMPLANT
SUTURE FIBERWR #2 38 T-5 BLUE (SUTURE) IMPLANT
SUTURE TAPE 1.3 40 TPR END (SUTURE) IMPLANT
SUTURETAPE 1.3 40 TPR END (SUTURE)
SYR CONTROL 10ML LL (SYRINGE) IMPLANT
TOWEL GREEN STERILE (TOWEL DISPOSABLE) ×3 IMPLANT
TOWEL GREEN STERILE FF (TOWEL DISPOSABLE) ×3 IMPLANT
TRAY FOL W/BAG SLVR 16FR STRL (SET/KITS/TRAYS/PACK) IMPLANT
TRAY FOLEY W/BAG SLVR 16FR LF (SET/KITS/TRAYS/PACK)
TRAY HUM REV SHOULDER STD +6 (Shoulder) ×2 IMPLANT
WATER STERILE IRR 1000ML POUR (IV SOLUTION) ×3 IMPLANT
YANKAUER SUCT BULB TIP NO VENT (SUCTIONS) ×2 IMPLANT

## 2018-12-25 NOTE — Brief Op Note (Signed)
   12/25/2018  11:41 AM  PATIENT:  Brenda Short  63 y.o. female  PRE-OPERATIVE DIAGNOSIS:  right shoulder osteoarthritis  POST-OPERATIVE DIAGNOSIS:  right shoulder osteoarthritis  PROCEDURE:  Procedure(s): RIGHT TOTAL SHOULDER ARTHROPLASTY  SURGEON:  Surgeon(s): Meredith Pel, MD  ASSISTANT: luke Magnant pa  ANESTHESIA:   general  EBL: 100 ml    Total I/O In: 1250 [I.V.:1000; IV Piggyback:250] Out: 750 [Urine:600; Blood:150]  BLOOD ADMINISTERED: none  DRAINS: none   LOCAL MEDICATIONS USED:  none  SPECIMEN:  No Specimen  COUNTS:  YES  TOURNIQUET:  * No tourniquets in log *  DICTATION: .Other Dictation: Dictation Number [id number]  PLAN OF CARE: Admit to inpatient   PATIENT DISPOSITION:  PACU - hemodynamically stable

## 2018-12-25 NOTE — Anesthesia Postprocedure Evaluation (Signed)
Anesthesia Post Note  Patient: Brenda Short  Procedure(s) Performed: RIGHT TOTAL SHOULDER ARTHROPLASTY (Right Shoulder)     Patient location during evaluation: PACU Anesthesia Type: General Level of consciousness: awake and alert Pain management: pain level controlled Vital Signs Assessment: post-procedure vital signs reviewed and stable Respiratory status: spontaneous breathing, nonlabored ventilation, respiratory function stable and patient connected to nasal cannula oxygen Cardiovascular status: blood pressure returned to baseline and stable Postop Assessment: no apparent nausea or vomiting Anesthetic complications: no    Last Vitals:  Vitals:   12/25/18 1410 12/25/18 1439  BP: 96/61 100/69  Pulse: 89 91  Resp: 20 18  Temp: (!) 36.1 C 36.8 C  SpO2: 97% 92%    Last Pain:  Vitals:   12/25/18 1439  TempSrc: Oral  PainSc: 0-No pain                 Marguerite Jarboe COKER

## 2018-12-25 NOTE — Transfer of Care (Signed)
Immediate Anesthesia Transfer of Care Note  Patient: Brenda Short  Procedure(s) Performed: RIGHT TOTAL SHOULDER ARTHROPLASTY (Right Shoulder)  Patient Location: PACU  Anesthesia Type:General and Regional  Level of Consciousness: drowsy and patient cooperative  Airway & Oxygen Therapy: Patient Spontanous Breathing and Patient connected to face mask oxygen  Post-op Assessment: Report given to RN, Post -op Vital signs reviewed and stable and Patient moving all extremities X 4  Post vital signs: Reviewed and stable  Last Vitals:  Vitals Value Taken Time  BP 116/88 12/25/18 1140  Temp 36.8 C 12/25/18 1140  Pulse 91 12/25/18 1142  Resp 15 12/25/18 1142  SpO2 100 % 12/25/18 1142  Vitals shown include unvalidated device data.  Last Pain:  Vitals:   12/25/18 0623  PainSc: 7          Complications: No apparent anesthesia complications

## 2018-12-25 NOTE — Progress Notes (Signed)
1435 Received pt from PACU, A&O x4, Right hand is numb, pt cannot move fingers yet. Right shoulder dressing dry and intact. Right arm in a sling. Denies pain.

## 2018-12-25 NOTE — Anesthesia Procedure Notes (Signed)
Procedure Name: Intubation Date/Time: 12/25/2018 7:52 AM Performed by: Julieta Bellini, CRNA Pre-anesthesia Checklist: Patient identified, Emergency Drugs available, Suction available and Patient being monitored Patient Re-evaluated:Patient Re-evaluated prior to induction Oxygen Delivery Method: Circle system utilized Preoxygenation: Pre-oxygenation with 100% oxygen Induction Type: IV induction Ventilation: Mask ventilation without difficulty Laryngoscope Size: Mac and 3 Grade View: Grade I Tube type: Oral Tube size: 7.0 mm Number of attempts: 1 Airway Equipment and Method: Stylet Placement Confirmation: ETT inserted through vocal cords under direct vision,  positive ETCO2 and breath sounds checked- equal and bilateral Secured at: 22 cm Tube secured with: Tape Dental Injury: Teeth and Oropharynx as per pre-operative assessment

## 2018-12-25 NOTE — H&P (Signed)
Brenda HiltsMargaret C Short is an 63 y.o. female.   Chief Complaint: Right shoulder pain HPI: Claris CheMargaret is a 63 year old patient with long history of right shoulder pain.  She is failed conservative measures.  She presents now for operative management after explanation of risks and benefits.  Based on preoperative templating the patient is scheduled for reverse shoulder replacement.  MRI scan 2 years ago did show her cuff to be intact however she has significant retroversion deformity which would potentially shorten the lifespan of a total shoulder replacement.  For that reason and because of her age her best option will be reverse shoulder replacement.  Risk and benefits are discussed.  Plan overnight observation with home health physical therapy as well as home CPM machine.  Past Medical History:  Diagnosis Date  . Allergy   . Anxiety   . Arthritis   . Fibromyalgia   . GERD (gastroesophageal reflux disease)   . Glenoid labral tear   . Headache(784.0)    TAKES ATENOLOL  . Hypertension   . MVP (mitral valve prolapse)   . PONV (postoperative nausea and vomiting)    [facility] no problems 9'13 with hip scope    Past Surgical History:  Procedure Laterality Date  . ABDOMINAL HYSTERECTOMY    . BREAST SURGERY     DUCTS REMOVED FROM RT BREAST 2008  . HIP ARTHROSCOPY     2010 L HIP ARTHROSCOPY   . HIP ARTHROSCOPY  04/20/2011   Procedure: ARTHROSCOPY HIP;  Surgeon: Gus RankinFrank V Aluisio;  Location: [facility];  Service: Orthopedics;  Laterality: Left;  debridement labral tear, chondroplasty  . JOINT REPLACEMENT    . TOTAL HIP ARTHROPLASTY  11/09/2011   Procedure: TOTAL HIP ARTHROPLASTY;  Surgeon: Loanne DrillingFrank V Aluisio, MD;  Location: [facility];  Service: Orthopedics;  Laterality: Left;  . TUBAL LIGATION     1986    Family History  Problem Relation Age of Onset  . Heart disease Other        No family history of   Social History:  reports that she has never smoked. She has never used smokeless tobacco. She  reports current alcohol use of about 7.0 standard drinks of alcohol per week. She reports that she does not use drugs.  Allergies:  Allergies  Allergen Reactions  . Morphine And Related Nausea And Vomiting    Medications Prior to Admission  Medication Sig Dispense Refill  . acetaminophen (TYLENOL) 500 MG tablet Take 1,000 mg by mouth daily as needed (pain).    Marland Kitchen. amitriptyline (ELAVIL) 25 MG tablet Take 25 mg by mouth at bedtime.     Marland Kitchen. atenolol (TENORMIN) 25 MG tablet Take 25 mg by mouth every morning.      . Azelastine HCl (ASTEPRO) 0.15 % SOLN Place 1 spray into the nose 2 (two) times daily as needed. For allergies    . docusate sodium (COLACE) 100 MG capsule Take 100 mg by mouth 2 (two) times daily as needed. CONSTIPATION      . gabapentin (NEURONTIN) 300 MG capsule Take 300-600 mg by mouth See admin instructions. 300 mg in the morning, and 600 mg at bedtime    . naproxen sodium (ALEVE) 220 MG tablet Take 440 mg by mouth daily as needed (pain).    . pantoprazole (PROTONIX) 40 MG tablet Take 40 mg by mouth daily with breakfast.     . PARoxetine (PAXIL) 20 MG tablet Take 30 mg by mouth at bedtime.     . pramipexole (MIRAPEX)  1 MG tablet Take 2 mg by mouth at bedtime.   1  . telmisartan-hydrochlorothiazide (MICARDIS HCT) 80-25 MG tablet Take 1 tablet by mouth daily.  4  . traMADol (ULTRAM) 50 MG tablet Take 1 tablet (50 mg total) by mouth every 12 (twelve) hours as needed. 30 tablet 0  . triamcinolone (NASACORT) 55 MCG/ACT nasal inhaler Place 2 sprays into the nose daily as needed. For allergies. Uses one or the other nose spray not both alternates    . trolamine salicylate (ASPERCREME) 10 % cream Apply 1 application topically as needed for muscle pain.    Marland Kitchen albuterol (PROVENTIL HFA;VENTOLIN HFA) 108 (90 BASE) MCG/ACT inhaler Inhale into the lungs every 6 (six) hours as needed for wheezing or shortness of breath.    . polyethylene glycol powder (MIRALAX) powder Take 17 g by mouth daily as  needed. CONSTIPATION     . rizatriptan (MAXALT) 10 MG tablet TAKE 1 TABLET BY MOUTH AS NEEDED FOR MIGRAINE. MAY REPEAT IN 2 HOURS IF BEEDED      No results found for this or any previous visit (from the past 31 hour(s)). No results found.  Review of Systems  Musculoskeletal: Positive for joint pain.  All other systems reviewed and are negative.   Blood pressure 112/80, pulse 72, temperature 98.9 F (37.2 C), resp. rate 20, height 5\' 10"  (1.778 m), weight 78.7 kg, SpO2 98 %. Physical Exam  Constitutional: She appears well-developed.  HENT:  Head: Normocephalic.  Eyes: Pupils are equal, round, and reactive to light.  Neck: Normal range of motion.  Cardiovascular: Normal rate.  Respiratory: Effort normal.  Neurological: She is alert.  Skin: Skin is warm.  Psychiatric: She has a normal mood and affect.  Examination of the right shoulder demonstrates functional deltoid.  She does have some coarseness and grinding with passive range of motion but rotator cuff strength is pretty reasonable.  Radial pulses intact.  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  Plan is for reverse shoulder replacement.  Patient does have a lot of retroversion for total shoulder replacement.  Risk and benefits are discussed including but not limited to infection nerve vessel damage incomplete healing of the implants into the bone interface.  Patient understands risk and benefits and wishes to proceed.  All questions answered  Anderson Malta, MD 12/25/2018, 7:27 AM

## 2018-12-25 NOTE — Op Note (Signed)
NAME: RHYLEI, MCQUAIG MEDICAL RECORD [id number] ACCOUNT [id number] DATE OF BIRTH:[birth year] FACILITY: [facility] LOCATION: [facility] PHYSICIAN:GREGORY Randel Pigg, MD  OPERATIVE REPORT  DATE OF PROCEDURE:  12/25/2018  PREOPERATIVE DIAGNOSIS:  Right shoulder severe end-stage arthritis.  POSTOPERATIVE DIAGNOSIS:  Right shoulder severe end-stage arthritis.  PROCEDURE:  Right reverse shoulder replacement using [employer] components including a size 12 mini humeral stem, comprehensive reverse shoulder mini humeral tray standard thickness 40 mm diameter with +6 mm taper offset with standard bearing 36 mm highly  cross-linked polyethylene bearing with comprehensive reverse shoulder glenosphere +3 offset 36 mm with large augmented baseplate and 1 central screw and 4 fixed locking screws.  SURGEON:  Meredith Pel, MD  ASSISTANT:  Lurena Joiner [place], [place].  INDICATIONS:  The patient is a 63 year old patient with end-stage right shoulder arthritis.  Preoperative templating demonstrated 41 degrees of glenoid retroversion.  This was corrected in the preoperative plan.  She presents now for operative management  after explanation of risks and benefits.  PROCEDURE IN DETAIL:  The patient was brought to the operating room where general anesthetic was induced.  Preoperative antibiotics administered.  Timeout was called.  The patient was placed in the beach chair position with the head in neutral position.   Right shoulder was pre-scrubbed first with hydrogen peroxide and alcohol and Betadine, which was allowed to air dry, then prepped with ChloraPrep solution and draped in sterile manner.  Ioban used to cover the entire operative field.  A deltopectoral  approach was made.  Skin and subcutaneous tissue were sharply divided.  Cephalic vein was ligated.  The deltopectoral interval was developed.  At this time, the biceps tendon was tenodesed to the pectoralis major tendon.  Subdeltoid adhesions were  released.  The  coracohumeral ligament was released.  Rotator interval was opened up to the base of the glenoid.  Circumflex vessels were ligated.  Axillary nerve was identified, visualized and tagged with a vessel loop and protected at all times during  the case.  At this time, the subscapularis was peeled off of the lesser tuberosity down to the 5 o'clock position.  The head was dislocated.  The intramedullary reaming was performed up to size 12.  Broaching was then performed and a size 10 was left in  place.  The head was cut at about 30 degrees of retroversion back to the junction of the rotator cuff attachment and tuberosity.  At this time, posterior retractors were placed.  Circumferential release of the labrum was performed with care being taken  to avoid injury to the axillary nerve.  No paralytics were in place at this time.  At this time the guide was placed in accordance with preoperative templating, guide pin was placed into the glenoid.  Reaming was performed about 17 mm inferiorly.  The  large augment was found to fit well.  Following this, initial reaming for the augment was performed which did give 100% coverage and bony bleeding.  The baseplate was a glenoid baseplate with a large augment was placed with good fixation from the central  screw as well as 4 peripheral locking screws.  At this time, a +3 glenosphere was chosen.  Humeral head was then broached up to a size 12 and reduction was performed with various sizes of trays and offsets.  The most stable was the 40 mm humeral tray +6  offset with standard bearing.  This gave excellent stability with forward flexion, abduction as well as with extension, adduction and anterior directed force.  Trial components were removed.  True components placed with same stability parameters  maintained.  Subscap was then repaired through drill holes in the humerus and repaired with arm in about 45 degrees of external rotation.  Rotator interval also closed with the arm in  45 degrees of external rotation.  Thorough irrigation throughout the  case was performed with IrriSept solution.  Vancomycin powder placed within the joint as well as on top of the closure.  The deltopectoral interval was closed using #1 Vicryl suture followed by interrupted inverted 0 Vicryl suture, 2-0 Vicryl suture and  a 3-0 Monocryl.  Steri-Strips and Aquacel dressing applied.  Shoulder immobilizer applied.  The patient tolerated the procedure well without immediate complications.  He was transferred to the recovery room in stable condition.  Luke's assistance was  required at all times for opening and closing and retraction.  His assistance was a medical necessity.  TN/NUANCE  D:12/25/2018 T:12/25/2018 JOB:[id number]

## 2018-12-25 NOTE — Progress Notes (Signed)
Patients BP has been running 80-90s/50s throughout day. Md paged, order for bolus. Patient has no complaints of lightheadedness or dizziness. Does complain of headache. Patient requested home medication Mirapex be ordered which MD approved. Will continue to monitor.

## 2018-12-25 NOTE — Anesthesia Procedure Notes (Addendum)
Anesthesia Regional Block: Interscalene brachial plexus block   Pre-Anesthetic Checklist: ,, timeout performed, Correct Patient, Correct Site, Correct Laterality, Correct Procedure, Correct Position, site marked, Risks and benefits discussed,  Surgical consent,  Pre-op evaluation,  At surgeon's request and post-op pain management  Laterality: Right  Prep: chloraprep       Needles:  Injection technique: Single-shot  Needle Type: Stimulator Needle - 40      Needle Gauge: 22     Additional Needles:   Procedures:, nerve stimulator,,,,,,,  Narrative:  Start time: 12/25/2018 6:58 AM End time: 12/25/2018 7:10 AM Injection made incrementally with aspirations every 5 mL.  Performed by: Personally  Anesthesiologist: Roberts Gaudy, MD  Additional Notes: 15 cc 0.5% Bupivacaine with 1:200 epi injected easilly 10 cc 1.3% Exparel injected easily

## 2018-12-26 DIAGNOSIS — M19011 Primary osteoarthritis, right shoulder: Principal | ICD-10-CM

## 2018-12-26 MED ORDER — OXYCODONE HCL 5 MG PO TABS: 5.0000 mg | ORAL_TABLET | Freq: Four times a day (QID) | ORAL | 0 refills | Status: DC | PRN

## 2018-12-26 MED ORDER — METHOCARBAMOL 500 MG PO TABS: 500.0000 mg | ORAL_TABLET | Freq: Three times a day (TID) | ORAL | 0 refills | Status: DC | PRN

## 2018-12-26 NOTE — Progress Notes (Signed)
  Subjective: Pt is a 63 y.o. Female s/p Rt TSA.  She is POD1.  Pt states that her pain is well-controlled but notes a "soreness that is beginning to throb a bit".  Denies numbness/tingling/weakness.  No fevers/chills.  Pt had difficulty with low blood pressures yesterday but today her most recent blood pressure is 116/78.  She does not feel dizzy or lightheaded.    Objective: Vital signs in last 24 hours: Temp:  [97 F (36.1 C)-98.7 F (37.1 C)] 98 F (36.7 C) (07/22 0419) Pulse Rate:  [67-101] 86 (07/22 0548) Resp:  [12-20] 16 (07/22 0419) BP: (78-116)/(52-88) 93/67 (07/22 0548) SpO2:  [91 %-100 %] 94 % (07/22 0419)  Intake/Output from previous day: 07/21 0701 - 07/22 0700 In: 1688.7 [P.O.:120; I.V.:1018.7; IV Piggyback:500] Out: 950 [Urine:800; Blood:150] Intake/Output this shift: No intake/output data recorded.  Exam:  2+ radial pulse of the R arm Normal sensation in the R hand No gross drainage or blood on the dressing 5/5 grip strength, finger abduction, wrist extension  Labs: No results for input(s): HGB in the last 72 hours. No results for input(s): WBC, RBC, HCT, PLT in the last 72 hours. No results for input(s): NA, K, CL, CO2, BUN, CREATININE, GLUCOSE, CALCIUM in the last 72 hours. No results for input(s): LABPT, INR in the last 72 hours.  Assessment/Plan: Pt is a 63 y.o. Female on POD1 s/p R TSA.    -D/c home today, per patient's wishes.  She feels comfortable with being discharged  -F/u in clinic with Dr. Marlou Sa in 2 weeks  -Okay to shower, dressing is waterproof  -CPM machine 3 times per day for 1 hour each time, increasing the degrees daily  -No lifting with the R arm   Gerrianne Scale Alissa Pharr 12/26/2018, 8:38 AM

## 2018-12-26 NOTE — Progress Notes (Signed)
Pt discharge education provided at bedside  Pt IV removed, catheter intact  Pt has all belongings including sling  Pt refused wheelchair, pt ambulated off unit with staff

## 2018-12-26 NOTE — Progress Notes (Signed)
Called OT regarding pt consult prior to discharge, rehab nurse stated she will inform rounding therapist   Pt aware of plan of care

## 2018-12-26 NOTE — TOC Transition Note (Signed)
Transition of Care [facility]) - CM/SW Discharge Note   Patient Details  Name: Brenda Short MRN: [id number] Date of Birth: 1956-05-19  [facility] CM/SW Contact:  Marilu Favre, RN Phone Number: 12/26/2018, 10:30 AM   Clinical Narrative:     CPM will be delivered to patient's home this afternoon.  Final next level of care: [place] Barriers to Discharge: No Barriers Identified   Patient Goals and CMS Choice Patient states their goals for this hospitalization and ongoing recovery are:: to go home CMS Medicare.gov Compare Post Acute Care list provided to:: Patient Choice offered to / list presented to : Patient  Discharge Placement                       Discharge Plan and Services   Discharge Planning Services: CM Consult Post Acute Care Choice: Home Health, Durable Medical Equipment          DME Arranged: Continuous passive motion machine DME Agency: [place] [facility](Spoke with Ovid Curd) Date DME Agency Contacted: 12/26/18 Time DME Agency Contacted: 6834 Representative spoke with at DME Agency: [place]: PT [place]: [facility] (now [facility]) Date [place]: 12/26/18 Time [place]: 1029 Representative spoke with at [place]: [place] ([place]) Interventions     Readmission Risk Interventions No flowsheet data found.

## 2018-12-26 NOTE — Progress Notes (Signed)
Occupational Therapy Evaluation Patient Details Name: Brenda Short MRN: [id number] DOB: January 31, 1956 Today's Date: 12/26/2018    History of Present Illness Brenda Short is a 63 year old patient with PMH of anxiety, OA, HTN, fibromyalgia presented with long history of R shoulder pain. Pt received a right total shoulder arthoplasty.    Clinical Impression   Pt presents with above diagnosis. PTA pt PLOF Mod I with use of compensatory strategies when completing ADLs due to shoulder pain. Pt currently limited in ADLs due to R shoulder pain, ROM, and function. Pt educated of appropriate don of sling, PROM handout with demonstration, as well as don/doff of clothing. Pt will benefit from continued therapy in outpatient as recommended by MD to maximize independence in ADLs. No further OT required at this time. Thank you for the order. OT signing off.     Follow Up Recommendations  Outpatient OT    Equipment Recommendations       Recommendations for Other Services       Precautions / Restrictions Precautions Precautions: Shoulder Shoulder Interventions: Shoulder sling/immobilizer Precaution Booklet Issued: Yes (comment) Required Braces or Orthoses: Sling Restrictions Weight Bearing Restrictions: Yes RUE Weight Bearing: Non weight bearing      Mobility Bed Mobility Overal bed mobility: Independent             General bed mobility comments: Pt received sitting up at EOB.   Transfers Overall transfer level: Modified independent                    Balance Overall balance assessment: Modified Independent                                         ADL either performed or assessed with clinical judgement   ADL Overall ADL's : Needs assistance/impaired Eating/Feeding: Set up;Sitting   Grooming: Set up;Sitting   Upper Body Bathing: Minimal assistance;Sitting   Lower Body Bathing: Minimal assistance;Sit to/from stand   Upper Body Dressing :  Minimal assistance;Sitting   Lower Body Dressing: Minimal assistance;Sit to/from stand   Toilet Transfer: Modified Independent           Functional mobility during ADLs: Modified independent General ADL Comments: Pt requires assist to management items due to decreased BUE function.      Vision         Perception     Praxis      Pertinent Vitals/Pain Pain Assessment: 0-10 Pain Score: 4  Pain Location: R shoulder Pain Descriptors / Indicators: Grimacing;Guarding Pain Intervention(s): Monitored during session;Repositioned     Hand Dominance Right   Extremity/Trunk Assessment Upper Extremity Assessment Upper Extremity Assessment: RUE deficits/detail RUE Deficits / Details: pain and limited ROM RUE Coordination: decreased gross motor       Cervical / Trunk Assessment Cervical / Trunk Assessment: Normal   Communication Communication Communication: No difficulties   Cognition Arousal/Alertness: Awake/alert Behavior During Therapy: WFL for tasks assessed/performed Overall Cognitive Status: Within Functional Limits for tasks assessed                                     General Comments  educated on sling, ROM precautions, and sleep positioning.     Exercises     Shoulder Instructions      Home Living Family/patient expects to be discharged to:: Private  residence Living Arrangements: Spouse/significant other Available Help at Discharge: Family;Available 24 hours/day Type of Home: House Home Access: Stairs to enter     Home Layout: Two level               Home Equipment: Bedside commode   Additional Comments: Pt reports having support from husband and children.      Prior Functioning/Environment Level of Independence: Independent with assistive device(s)        Comments: Limited with some R shd movement. Compensated with use of LUE.         OT Problem List: Decreased strength;Decreased range of motion;Decreased safety  awareness;Impaired UE functional use      OT Treatment/Interventions:      OT Goals(Current goals can be found in the care plan section) Acute Rehab OT Goals Patient Stated Goal: to take care of myself OT Goal Formulation: With patient Time For Goal Achievement: 01/09/19 Potential to Achieve Goals: Good  OT Frequency:     Barriers to D/C:            Co-evaluation              AM-PAC OT "6 Clicks" Daily Activity     Outcome Measure Help from another person eating meals?: A Little Help from another person taking care of personal grooming?: A Little Help from another person toileting, which includes using toliet, bedpan, or urinal?: A Little Help from another person bathing (including washing, rinsing, drying)?: A Little Help from another person to put on and taking off regular upper body clothing?: A Little Help from another person to put on and taking off regular lower body clothing?: A Little 6 Click Score: 18   End of Session Nurse Communication: Mobility status;Other (comment)(informed RN of completed evaluation)  Activity Tolerance: Patient tolerated treatment well Patient left: in bed;with call bell/phone within reach  OT Visit Diagnosis: Pain;Muscle weakness (generalized) (M62.81) Pain - Right/Left: Right Pain - part of body: Shoulder                Time: [id number] OT Time Calculation (min): 29 min Charges:  OT General Charges $OT Visit: 1 Visit OT Evaluation $OT Eval Low Complexity: 1 Low OT Treatments $Self Care/Home Management : 8-22 mins  [place] OldEvan Rafia Shedden, MSOT, OTR/L  Supplemental Rehabilitation Services  [phone]   Zigmund DanielEvan M Nakiah Osgood 12/26/2018, 2:27 PM

## 2018-12-26 NOTE — Discharge Summary (Signed)
Physician Discharge Summary      Patient ID: Cristy HiltsMargaret C Smolen MRN: [id number] DOB/AGE: June 28, 1955 63 y.o.  Admit date: 12/25/2018 Discharge date: 12/26/2018  Admission Diagnoses:  Active Problems:   OA (osteoarthritis) of shoulder   Discharge Diagnoses:  Same  Surgeries: Procedure(s): RIGHT TOTAL SHOULDER ARTHROPLASTY on 12/25/2018   Consultants:   Discharged Condition: Stable  Hospital Course: Cristy HiltsMargaret C Zavaleta is an 63 y.o. female who was admitted 12/25/2018 with a chief complaint of R shoulder pain, and found to have a diagnosis of R shoulder arthritis.  They were brought to the operating room on 12/25/2018 and underwent the above named procedures.  Pt tolerated the procedure well and was extubated without incident. She was transferred to [facility] for inpatient care.  Pt's blood pressure was dropping to a systolic of 80-90, so a bolus was ordered.  On POD1, pt's pain was doing well and her blood pressure had improved to a systolic of 116. Pt was discharged on POD1 and will f/u with Dr. August Saucerean in clinic in 2 weeks.   Antibiotics given:  Anti-infectives (From admission, onward)   Start     Dose/Rate Route Frequency Ordered Stop   12/25/18 1500  ceFAZolin (ANCEF) IVPB 1 g/50 mL premix     1 g 100 mL/hr over 30 Minutes Intravenous Every 6 hours 12/25/18 1440 12/25/18 2136   12/25/18 1030  vancomycin (VANCOCIN) powder  Status:  Discontinued       As needed 12/25/18 1030 12/25/18 1135   12/25/18 0615  ceFAZolin (ANCEF) IVPB 2g/100 mL premix     2 g 200 mL/hr over 30 Minutes Intravenous On call to O.R. 12/25/18 [id number] 12/25/18 0758    .  Recent vital signs:  Vitals:   12/26/18 1242 12/26/18 1410  BP: (!) 88/58 100/61  Pulse: 88 95  Resp: 17   Temp: 98.4 F (36.9 C)   SpO2: 96%     Recent laboratory studies:  Results for orders placed or performed during the hospital encounter of 12/22/18  SARS Coronavirus 2 (Performed [facility])   Specimen: Nasal Swab   Result Value Ref Range   SARS Coronavirus 2 NEGATIVE NEGATIVE    Discharge Medications:   Allergies as of 12/26/2018      Reactions   Morphine And Related Nausea And Vomiting      Medication List    STOP taking these medications   acetaminophen 500 MG tablet Commonly known as: TYLENOL   traMADol 50 MG tablet Commonly known as: ULTRAM     TAKE these medications   albuterol 108 (90 Base) MCG/ACT inhaler Commonly known as: VENTOLIN HFA Inhale into the lungs every 6 (six) hours as needed for wheezing or shortness of breath.   amitriptyline 25 MG tablet Commonly known as: ELAVIL Take 25 mg by mouth at bedtime.   Astepro 0.15 % Soln Generic drug: Azelastine HCl Place 1 spray into the nose 2 (two) times daily as needed. For allergies   atenolol 25 MG tablet Commonly known as: TENORMIN Take 25 mg by mouth every morning.   docusate sodium 100 MG capsule Commonly known as: COLACE Take 100 mg by mouth 2 (two) times daily as needed. CONSTIPATION   gabapentin 300 MG capsule Commonly known as: NEURONTIN Take 300-600 mg by mouth See admin instructions. 300 mg in the morning, and 600 mg at bedtime   methocarbamol 500 MG tablet Commonly known as: ROBAXIN Take 1 tablet (500 mg total) by mouth every 8 (eight) hours  as needed for muscle spasms.   MiraLax 17 GM/SCOOP powder Generic drug: polyethylene glycol powder Take 17 g by mouth daily as needed. CONSTIPATION   naproxen sodium 220 MG tablet Commonly known as: ALEVE Take 440 mg by mouth daily as needed (pain).   oxyCODONE 5 MG immediate release tablet Commonly known as: Oxy IR/ROXICODONE Take 1-2 tablets (5-10 mg total) by mouth every 6 (six) hours as needed for moderate pain (pain score 4-6).   pantoprazole 40 MG tablet Commonly known as: PROTONIX Take 40 mg by mouth daily with breakfast.   PARoxetine 20 MG tablet Commonly known as: PAXIL Take 30 mg by mouth at bedtime.   pramipexole 1 MG tablet Commonly known as:  MIRAPEX Take 2 mg by mouth at bedtime.   rizatriptan 10 MG tablet Commonly known as: MAXALT TAKE 1 TABLET BY MOUTH AS NEEDED FOR MIGRAINE. MAY REPEAT IN 2 HOURS IF BEEDED   telmisartan-hydrochlorothiazide 80-25 MG tablet Commonly known as: MICARDIS HCT Take 1 tablet by mouth daily.   triamcinolone 55 MCG/ACT nasal inhaler Commonly known as: NASACORT Place 2 sprays into the nose daily as needed. For allergies. Uses one or the other nose spray not both alternates   trolamine salicylate 10 % cream Commonly known as: ASPERCREME Apply 1 application topically as needed for muscle pain.       Diagnostic Studies: Dg Shoulder Right Port  Result Date: 12/25/2018 CLINICAL DATA:  Postop day 0 RIGHT total shoulder arthroplasty. EXAM: PORTABLE RIGHT SHOULDER COMPARISON:  RIGHT shoulder CT 09/12/2018. FINDINGS: Anatomic alignment post RIGHT total shoulder arthroplasty. No acute complicating features. Acromioclavicular joint anatomically aligned. IMPRESSION: Anatomic alignment post RIGHT total shoulder arthroplasty without acute complicating features. Electronically Signed   By: Evangeline Dakin M.D.   On: 12/25/2018 14:38    Disposition: Discharge disposition: 01-Home or Self Care       Discharge Instructions    Call MD / Call 911   Complete by: As directed    If you experience chest pain or shortness of breath, CALL 911 and be transported to the hospital emergency room.  If you develope a fever above 101 F, pus (white drainage) or increased drainage or redness at the wound, or calf pain, call your surgeon's office.   Constipation Prevention   Complete by: As directed    Drink plenty of fluids.  Prune juice may be helpful.  You may use a stool softener, such as Colace (over the counter) 100 mg twice a day.  Use MiraLax (over the counter) for constipation as needed.   Diet - low sodium heart healthy   Complete by: As directed    Discharge instructions   Complete by: As directed    Okay  to shower, dressing is waterproof.  No baths, pools, lakes. CPM machine for 1 hour, 3 times per day.  Increase the degrees of range of motion daily No lifting with the R arm. Follow-up in 2 weeks with Dr. Marlou Sa   Increase activity slowly as tolerated   Complete by: As directed    Lifting restrictions   Complete by: As directed    No lifting with the R arm      Follow-up Information    [facility].   Specialty: [facility] Contact information: [address] [address] [phone]            Signed: Donella Stade 12/26/2018, 4:17 PM

## 2018-12-27 ENCOUNTER — Encounter (HOSPITAL_COMMUNITY): Payer: Self-pay | Admitting: Orthopedic Surgery

## 2018-12-28 ENCOUNTER — Telehealth: Payer: Self-pay | Admitting: Orthopedic Surgery

## 2018-12-28 ENCOUNTER — Encounter: Payer: Self-pay | Admitting: Orthopedic Surgery

## 2018-12-28 MED ORDER — FUROSEMIDE 20 MG PO TABS: ORAL_TABLET | ORAL | 0 refills | Status: DC

## 2018-12-28 NOTE — Telephone Encounter (Signed)
Referral sent for [facility]. Patient is requesting for OP.PT.  Please set that up with another clininc as they do not do OP. Need another agency.  This was patients request

## 2018-12-28 NOTE — Telephone Encounter (Signed)
Rx done pls call thx

## 2018-12-28 NOTE — Telephone Encounter (Signed)
Please advise Patient does not want HHPT would rather have outpatient. Can you please write order? Thanks.

## 2018-12-28 NOTE — Telephone Encounter (Signed)
I s/w patient. She was confused and does want HHPT I s/w Sonia Side he will change this and get someone out to her home. Also sent in lasix per Dr Marlou Sa.

## 2018-12-31 ENCOUNTER — Telehealth: Payer: Self-pay | Admitting: Orthopedic Surgery

## 2018-12-31 NOTE — Telephone Encounter (Signed)
Received call from Verdis Frederickson (PT) with [facility] needing verbal orders for HHPT 1 Wk 2   The number to contact Verdis Frederickson is [phone]

## 2018-12-31 NOTE — Telephone Encounter (Signed)
IC no answer. LMVM with verbal orders.

## 2019-01-04 ENCOUNTER — Telehealth: Payer: Self-pay | Admitting: Orthopedic Surgery

## 2019-01-04 NOTE — Telephone Encounter (Signed)
I called Clair Gulling and advised. He wanted to be sure that we understood he is occupational therapy. I advised I would document in the chart.

## 2019-01-04 NOTE — Telephone Encounter (Signed)
Clair Gulling, PT, from [facility] called requesting VO for PT for the following:  1x a week for 1 week 2x a week for 1 week.  He wants to make sure that he is going by Dr. Randel Pigg protocol.  CB#[phone].  Thank you.

## 2019-01-04 NOTE — Telephone Encounter (Signed)
Please advise 

## 2019-01-04 NOTE — Telephone Encounter (Signed)
Ok for pt

## 2019-01-09 ENCOUNTER — Ambulatory Visit (INDEPENDENT_AMBULATORY_CARE_PROVIDER_SITE_OTHER): Payer: Medicare Other | Admitting: Orthopedic Surgery

## 2019-01-09 ENCOUNTER — Ambulatory Visit (INDEPENDENT_AMBULATORY_CARE_PROVIDER_SITE_OTHER): Payer: Medicare Other

## 2019-01-09 ENCOUNTER — Encounter: Payer: Self-pay | Admitting: Orthopedic Surgery

## 2019-01-09 DIAGNOSIS — Z96611 Presence of right artificial shoulder joint: Secondary | ICD-10-CM | POA: Diagnosis not present

## 2019-01-09 MED ORDER — OXYCODONE HCL 5 MG PO TABS: 5.0000 mg | ORAL_TABLET | Freq: Four times a day (QID) | ORAL | 0 refills | Status: DC | PRN

## 2019-01-09 MED ORDER — METHOCARBAMOL 500 MG PO TABS: 500.0000 mg | ORAL_TABLET | Freq: Three times a day (TID) | ORAL | 0 refills | Status: DC | PRN

## 2019-01-09 NOTE — Progress Notes (Signed)
Post-Op Visit Note   Patient: Brenda Short           Date of Birth: Sep 29, 1955           MRN: [id number] Visit Date: 01/09/2019 PCP: Martha ClanShaw, William, MD   Assessment & Plan:  Chief Complaint:  Chief Complaint  Patient presents with  . Right Shoulder - Routine Post Op   Visit Diagnoses:  1. S/P reverse total shoulder arthroplasty, right     Plan: Patient is a 63 year old female s/p right reverse total shoulder arthroplasty on 12/25/2018.  Patient states she is doing very well and her incisions look good in the office today.  She has been doing home health physical therapy.  She has reached 90 degrees on the CPM machine and is using it once a day.  Her x-rays today in the clinic reveal a well fixed implant with no evidence of loosening or fracture.  Exam reveals external rotation to 45 degrees of the right shoulder and forward flexion and abduction to about 90 degrees.  Patient will begin outpatient physical therapy 3 times a week for 4 weeks.  Refill prescription for oxycodone and Robaxin.  Patient will follow-up in 4 weeks and we will discuss her return to work at this appointment.  Follow-Up Instructions: No follow-ups on file.   Orders:  Orders Placed This Encounter  Procedures  . XR Shoulder Right   Meds ordered this encounter  Medications  . methocarbamol (ROBAXIN) 500 MG tablet    Sig: Take 1 tablet (500 mg total) by mouth every 8 (eight) hours as needed for muscle spasms.    Dispense:  20 tablet    Refill:  0  . oxyCODONE (OXY IR/ROXICODONE) 5 MG immediate release tablet    Sig: Take 1-2 tablets (5-10 mg total) by mouth every 6 (six) hours as needed for moderate pain (pain score 4-6).    Dispense:  30 tablet    Refill:  0    Imaging: No results found.  PMFS History: Patient Active Problem List   Diagnosis Date Noted  . OA (osteoarthritis) of shoulder 12/25/2018  . Epistaxis, recurrent 09/07/2013  . Postop Hyponatremia 11/11/2011  . Postop Hypokalemia  11/11/2011  . OA (osteoarthritis) of hip 11/09/2011  . Preop cardiovascular exam 10/07/2011  . Chest pain 10/07/2011  . Mitral valve prolapse 10/07/2011  . Hypertension 10/07/2011   Past Medical History:  Diagnosis Date  . Allergy   . Anxiety   . Arthritis   . Fibromyalgia   . GERD (gastroesophageal reflux disease)   . Glenoid labral tear   . Headache(784.0)    TAKES ATENOLOL  . Hypertension   . MVP (mitral valve prolapse)   . PONV (postoperative nausea and vomiting)    WITH [facility] DR[facility]/ no problems 9'13 with hip scope    Family History  Problem Relation Age of Onset  . Heart disease Other        No family history of    Past Surgical History:  Procedure Laterality Date  . ABDOMINAL HYSTERECTOMY    . BREAST SURGERY     DUCTS REMOVED FROM RT BREAST 2008  . HIP ARTHROSCOPY     2010 L HIP ARTHROSCOPY   . HIP ARTHROSCOPY  04/20/2011   Procedure: ARTHROSCOPY HIP;  Surgeon: Gus RankinFrank V Aluisio;  Location: [facility];  Service: Orthopedics;  Laterality: Left;  debridement labral tear, chondroplasty  . JOINT REPLACEMENT    . TOTAL HIP ARTHROPLASTY  11/09/2011   Procedure: TOTAL HIP  ARTHROPLASTY;  Surgeon: Gearlean Alf, MD;  Location: [facility];  Service: Orthopedics;  Laterality: Left;  . TOTAL SHOULDER ARTHROPLASTY Right 12/25/2018  . TOTAL SHOULDER ARTHROPLASTY Right 12/25/2018   Procedure: RIGHT TOTAL SHOULDER ARTHROPLASTY;  Surgeon: Meredith Pel, MD;  Location: [place];  Service: Orthopedics;  Laterality: Right;  . TUBAL LIGATION     1986   Social History   Occupational History    Employer: [employer]    Comment: Nurse  Tobacco Use  . Smoking status: Never Smoker  . Smokeless tobacco: Never Used  Substance and Sexual Activity  . Alcohol use: Yes    Alcohol/week: 7.0 standard drinks    Types: 7 Glasses of wine per week    Comment: 1-2 glasses of wine per day  . Drug use: No  . Sexual activity: Yes

## 2019-01-10 ENCOUNTER — Encounter: Payer: Self-pay | Admitting: Orthopedic Surgery

## 2019-02-06 ENCOUNTER — Encounter: Payer: Self-pay | Admitting: Orthopedic Surgery

## 2019-02-06 ENCOUNTER — Ambulatory Visit (INDEPENDENT_AMBULATORY_CARE_PROVIDER_SITE_OTHER): Payer: Medicare Other | Admitting: Orthopedic Surgery

## 2019-02-06 DIAGNOSIS — Z96611 Presence of right artificial shoulder joint: Secondary | ICD-10-CM

## 2019-02-06 NOTE — Progress Notes (Signed)
Post-Op Visit Note   Patient: Brenda Short           Date of Birth: 1955/08/16           MRN: [id number] Visit Date: 02/06/2019 PCP: Marton Redwood, MD   Assessment & Plan:  Chief Complaint:  Chief Complaint  Patient presents with  . Right Shoulder - Follow-up   Visit Diagnoses:  1. S/P reverse total shoulder arthroplasty, right     Plan: Carra is now about 6 weeks out right reverse shoulder replacement.  On exam she has forward flexion about 150 abduction at 90.  Pain relief has been good.  Taking occasionally oxycodone at night.  Is in physical therapy for strengthening.  On exam her subscap and infraspinatus strength is good.  I want her to continue to work on shoulder range of motion exercises.  Come back in 6 weeks for final check.  Follow-Up Instructions: Return in about 6 weeks (around 03/20/2019).   Orders:  No orders of the defined types were placed in this encounter.  No orders of the defined types were placed in this encounter.   Imaging: No results found.  PMFS History: Patient Active Problem List   Diagnosis Date Noted  . OA (osteoarthritis) of shoulder 12/25/2018  . Epistaxis, recurrent 09/07/2013  . Postop Hyponatremia 11/11/2011  . Postop Hypokalemia 11/11/2011  . OA (osteoarthritis) of hip 11/09/2011  . Preop cardiovascular exam 10/07/2011  . Chest pain 10/07/2011  . Mitral valve prolapse 10/07/2011  . Hypertension 10/07/2011   Past Medical History:  Diagnosis Date  . Allergy   . Anxiety   . Arthritis   . Fibromyalgia   . GERD (gastroesophageal reflux disease)   . Glenoid labral tear   . Headache(784.0)    TAKES ATENOLOL  . Hypertension   . MVP (mitral valve prolapse)   . PONV (postoperative nausea and vomiting)    WITH [facility] DRIP/ no problems 9'13 with hip scope    Family History  Problem Relation Age of Onset  . Heart disease Other        No family history of    Past Surgical History:  Procedure Laterality Date  .  ABDOMINAL HYSTERECTOMY    . BREAST SURGERY     DUCTS REMOVED FROM RT BREAST 2008  . HIP ARTHROSCOPY     2010 L HIP ARTHROSCOPY   . HIP ARTHROSCOPY  04/20/2011   Procedure: ARTHROSCOPY HIP;  Surgeon: Dione Plover Aluisio;  Location: [facility];  Service: Orthopedics;  Laterality: Left;  debridement labral tear, chondroplasty  . JOINT REPLACEMENT    . TOTAL HIP ARTHROPLASTY  11/09/2011   Procedure: TOTAL HIP ARTHROPLASTY;  Surgeon: Gearlean Alf, MD;  Location: [facility];  Service: Orthopedics;  Laterality: Left;  . TOTAL SHOULDER ARTHROPLASTY Right 12/25/2018  . TOTAL SHOULDER ARTHROPLASTY Right 12/25/2018   Procedure: RIGHT TOTAL SHOULDER ARTHROPLASTY;  Surgeon: Meredith Pel, MD;  Location: [place];  Service: Orthopedics;  Laterality: Right;  . TUBAL LIGATION     1986   Social History   Occupational History    Employer: [employer]    Comment: Nurse  Tobacco Use  . Smoking status: Never Smoker  . Smokeless tobacco: Never Used  Substance and Sexual Activity  . Alcohol use: Yes    Alcohol/week: 7.0 standard drinks    Types: 7 Glasses of wine per week    Comment: 1-2 glasses of wine per day  . Drug use: No  . Sexual activity:  Yes

## 2019-02-18 ENCOUNTER — Encounter: Payer: Self-pay | Admitting: Orthopedic Surgery

## 2019-02-21 ENCOUNTER — Encounter: Payer: Self-pay | Admitting: Orthopedic Surgery

## 2019-03-05 LAB — CMP, EXTERNAL
ALBUMIN, EXTERNAL: 3.8
ALBUMIN, EXTERNAL: 3.8 NA
ALK PHOS, EXTERNAL: 85
ALK PHOS, EXTERNAL: 85 NA
BILI TOTAL, EXTERNAL: 0.7
EGFR IF AFA, EXTERNAL: >76
EGFR IF AFA, EXTERNAL: >76 NA
SGOT (AST), EXTERNAL: 22
SGOT (AST), EXTERNAL: 22 NA
SGPT (ALT), EXTERNAL: 20
SGPT (ALT), EXTERNAL: 20 NA
Total Bilirubin, EXTERNAL: 0.7 NA

## 2019-03-05 LAB — CBC, EXTERNAL
HGB, EXTERNAL: 12.6
Hemoglobin, EXTERNAL: 12.6 NA
MCH, EXTERNAL: 29.9
MCH, EXTERNAL: 29.9 NA
MCV, EXTERNAL: 92
MCV, EXTERNAL: 92 NA
Platelets, EXTERNAL: 289
Platelets, External: 289 NA
RBC, EXTERNAL: 4.2
RBC, External: 4.2 NA
WBC, EXTERNAL: 5.5
WBC, EXTERNAL: 5.5 NA

## 2019-03-05 LAB — BMP, EXTERNAL
CO2, EXTERNAL: 27
CO2, External: 27 NA
CREATININE SER, EXTERNAL: 0.9
CREATININE SER, EXTERNAL: 0.9 NA
Calcium, EXTERNAL: 9.5
Calcium, EXTERNAL: 9.5 NA
Chloride, EXTERNAL: 103
Chloride, EXTERNAL: 103 NA
Glucose SER, EXTERNAL: 104
Glucose, EXTERNAL: 104 NA
Potassium, EXTERNAL: 4.5
Potassium, External: 4.5 NA
Sodium, EXTERNAL: 141
Sodium, External: 141 NA

## 2019-03-05 LAB — AMB EXT HGBA1C
Hemoglobin A1C, External: 5.4 %
Hemoglobin A1c, External: 5.4 %

## 2019-03-05 LAB — LIPID PANEL
HDL, EXTERNAL: 63 NA
HDL, External: 63
LDL-C, External: 153
LDL-C, External: 153 NA
TOTAL CHOLESTEROL, EXTERNAL: 252 NA
TRIGLYCERIDES, EXTERNAL: 179 NA
Total Cholesterol, External: 252
Triglycerides, External: 179

## 2019-03-05 LAB — AMB EXT TSH
TSH, EXTERNAL: 1.25
TSH, EXTERNAL: 1.25 NA

## 2019-03-12 ENCOUNTER — Other Ambulatory Visit: Payer: Self-pay | Admitting: Internal Medicine

## 2019-03-12 DIAGNOSIS — Z1231 Encounter for screening mammogram for malignant neoplasm of breast: Secondary | ICD-10-CM

## 2019-03-14 ENCOUNTER — Other Ambulatory Visit: Payer: Self-pay | Admitting: Internal Medicine

## 2019-03-14 DIAGNOSIS — E785 Hyperlipidemia, unspecified: Secondary | ICD-10-CM

## 2019-03-15 ENCOUNTER — Ambulatory Visit
Admission: RE | Admit: 2019-03-15 | Discharge: 2019-03-15 | Disposition: A | Discharge status: Home / Self Care | Payer: Medicare Other | Source: Ambulatory Visit | Attending: Internal Medicine | Admitting: Internal Medicine

## 2019-03-15 ENCOUNTER — Other Ambulatory Visit: Payer: Self-pay

## 2019-03-15 DIAGNOSIS — Z1231 Encounter for screening mammogram for malignant neoplasm of breast: Secondary | ICD-10-CM

## 2019-03-22 ENCOUNTER — Ambulatory Visit (INDEPENDENT_AMBULATORY_CARE_PROVIDER_SITE_OTHER): Payer: Medicare Other | Admitting: Orthopedic Surgery

## 2019-03-22 ENCOUNTER — Other Ambulatory Visit: Payer: Self-pay

## 2019-03-22 ENCOUNTER — Encounter: Payer: Self-pay | Admitting: Orthopedic Surgery

## 2019-03-22 VITALS — Ht 70.0 in | Wt 173.0 lb

## 2019-03-22 DIAGNOSIS — Z96611 Presence of right artificial shoulder joint: Secondary | ICD-10-CM

## 2019-03-22 MED ORDER — AMOXICILLIN 500 MG PO TABS: ORAL_TABLET | ORAL | 0 refills | Status: DC

## 2019-03-22 NOTE — Progress Notes (Signed)
Post-Op Visit Note   Patient: Brenda Short           Date of Birth: 1955-10-15           MRN: [id number] Visit Date: 03/22/2019 PCP: Marton Redwood, MD   Assessment & Plan:  Chief Complaint:  Chief Complaint  Patient presents with  . Right Shoulder - Follow-up    12/25/2018 Right TSA   Visit Diagnoses:  1. S/P reverse total shoulder arthroplasty, right     Plan: Brean is now about 3 months out right reverse shoulder replacement.  She had significant preop glenoid deformity.  She has been doing well in therapy.  On exam she has forward flexion and abduction both above 90.  Strength is also very good to infraspinatus and subscap testing.  Still a little difficult for her to sleep on the right-hand side.  I like for her to continue therapy 6 more weeks transition to home exercise program to focus on stretching and strengthening with 15 pound lifting limit moving forward.  Follow-up with me as needed.  Need for antibiotics prophylaxis before any procedures discussed.  She will need to do that for a couple of years. Follow-Up Instructions: Return if symptoms worsen or fail to improve.   Orders:  No orders of the defined types were placed in this encounter.  Meds ordered this encounter  Medications  . amoxicillin (AMOXIL) 500 MG tablet    Sig: Take 4 tablets (2 g) 30 mins to an hour prior to dental procedure.    Dispense:  12 tablet    Refill:  0    Imaging: No results found.  PMFS History: Patient Active Problem List   Diagnosis Date Noted  . OA (osteoarthritis) of shoulder 12/25/2018  . Epistaxis, recurrent 09/07/2013  . Postop Hyponatremia 11/11/2011  . Postop Hypokalemia 11/11/2011  . OA (osteoarthritis) of hip 11/09/2011  . Preop cardiovascular exam 10/07/2011  . Chest pain 10/07/2011  . Mitral valve prolapse 10/07/2011  . Hypertension 10/07/2011   Past Medical History:  Diagnosis Date  . Allergy   . Anxiety   . Arthritis   . Fibromyalgia   . GERD  (gastroesophageal reflux disease)   . Glenoid labral tear   . Headache(784.0)    TAKES ATENOLOL  . Hypertension   . MVP (mitral valve prolapse)   . PONV (postoperative nausea and vomiting)    WITH [facility] DRIP/ no problems 9'13 with hip scope    Family History  Problem Relation Age of Onset  . Heart disease Other        No family history of    Past Surgical History:  Procedure Laterality Date  . ABDOMINAL HYSTERECTOMY    . BREAST SURGERY     DUCTS REMOVED FROM RT BREAST 2008  . HIP ARTHROSCOPY     2010 L HIP ARTHROSCOPY   . HIP ARTHROSCOPY  04/20/2011   Procedure: ARTHROSCOPY HIP;  Surgeon: Dione Plover Aluisio;  Location: [facility];  Service: Orthopedics;  Laterality: Left;  debridement labral tear, chondroplasty  . JOINT REPLACEMENT    . TOTAL HIP ARTHROPLASTY  11/09/2011   Procedure: TOTAL HIP ARTHROPLASTY;  Surgeon: Gearlean Alf, MD;  Location[facility];  Service: Orthopedics;  Laterality: Left;  . TOTAL SHOULDER ARTHROPLASTY Right 12/25/2018  . TOTAL SHOULDER ARTHROPLASTY Right 12/25/2018   Procedure: RIGHT TOTAL SHOULDER ARTHROPLASTY;  Surgeon: Meredith Pel, MD;  Location: [facility];  Service: Orthopedics;  Laterality: Right;  . TUBAL LIGATION  1986   Social History   Occupational History    Employer: [employer]    Comment: Nurse  Tobacco Use  . Smoking status: Never Smoker  . Smokeless tobacco: Never Used  Substance and Sexual Activity  . Alcohol use: Yes    Alcohol/week: 7.0 standard drinks    Types: 7 Glasses of wine per week    Comment: 1-2 glasses of wine per day  . Drug use: No  . Sexual activity: Yes

## 2019-03-26 ENCOUNTER — Ambulatory Visit
Admission: RE | Admit: 2019-03-26 | Discharge: 2019-03-26 | Disposition: A | Discharge status: Home / Self Care | Payer: No Typology Code available for payment source | Source: Ambulatory Visit | Attending: Internal Medicine | Admitting: Internal Medicine

## 2019-03-26 DIAGNOSIS — E785 Hyperlipidemia, unspecified: Secondary | ICD-10-CM

## 2019-07-02 ENCOUNTER — Ambulatory Visit: Payer: Medicare Other

## 2019-07-06 ENCOUNTER — Inpatient Hospital Stay
Admit: 2019-07-06 | Discharge: 2019-07-06 | Payer: MEDICARE | Primary: Student in an Organized Health Care Education/Training Program

## 2019-07-06 DIAGNOSIS — Z23 Encounter for immunization: Secondary | ICD-10-CM

## 2019-07-23 ENCOUNTER — Ambulatory Visit: Payer: Medicare Other

## 2019-08-03 ENCOUNTER — Inpatient Hospital Stay
Admit: 2019-08-03 | Discharge: 2019-08-03 | Payer: MEDICARE | Primary: Student in an Organized Health Care Education/Training Program

## 2019-08-03 DIAGNOSIS — Z23 Encounter for immunization: Secondary | ICD-10-CM

## 2019-09-18 ENCOUNTER — Ambulatory Visit: Attending: Rheumatology

## 2019-09-18 ENCOUNTER — Ambulatory Visit
Admit: 2019-09-18 | Discharge: 2019-09-18 | Payer: MEDICARE | Attending: Rheumatology | Primary: Student in an Organized Health Care Education/Training Program

## 2019-09-18 DIAGNOSIS — M25561 Pain in right knee: Secondary | ICD-10-CM

## 2019-09-18 MED ORDER — METHYLPREDNISOLONE 40 MG/ML SUSP FOR INJECTION
40 mg/mL | Freq: Once | INTRAMUSCULAR | Status: AC
Start: 2019-09-18 — End: 2019-09-18
  Administered 2019-09-18: 18:00:00 via INTRA_ARTICULAR

## 2019-09-18 MED ORDER — METHYLPREDNISOLONE 40 MG/ML SUSP FOR INJECTION
40 mg/mL | Freq: Once | INTRAMUSCULAR | Status: AC
Start: 2019-09-18 — End: 2019-09-18
  Administered 2019-09-18: 19:00:00 via INTRA_ARTICULAR

## 2019-09-18 NOTE — Progress Notes (Signed)
Rheumatology Consult Note       [facility]   [place] [address]  [phone]    Please CC this note to : Dr Williemae Natter      Rheumatology History:        HPI     Karen Fernandez is a 64 year old female with history of hypertension and fibromyalgia who is seen urgently for worsening lower extremity pain.  I am seeing her at the request of Dr. Williemae Natter. Her regular PCP is Dr  Marton Redwood in [place].    She was diagnosed with fibromyalgia around 2009. She recalls seeing a rheumatology nurse practitioner who confirmed the diagnosis.  Does not recall being advised that she has an underlying rheumatic autoimmune disease causing her pain.  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  She reports that for several months, she has worsening pain her hips, knees and ankles.  She reports having stiffness after prolonged sitting but she also has pain after prolonged walking.  She usually likes to walk for exercise.  No significant swelling noted.  Takes Aleve and Tylenol which does not provide significant relief for her knees but does provide relief for the rest of her joints.    She has chronic dry eyes, dry mouth.  She has alternating episodes of diarrhea and constipation.  She denies any photosensitive skin rash.  Denies any recent infection or unexplained weight loss    No known family history of rheumatic autoimmune disease.      Past Medical History:   Diagnosis Date   ??? Allergic rhinitis    ??? Hypercholesterolemia    ??? Hypertension    ??? Migraine    ??? RLS (restless legs syndrome)      No past surgical history on file.  No family history on file.  Social History     Socioeconomic History   ??? Marital status: MARRIED     Spouse name: Not on file   ??? Number of children: Not on file   ??? Years of education: Not on file   ??? Highest education level: Not on file   Occupational History   ??? Not on file   Social Needs   ??? Financial resource strain:  Not on file   ??? Food insecurity     Worry: Not on file     Inability: Not on file   ??? Transportation needs     Medical: Not on file     Non-medical: Not on file   Tobacco Use   ??? Smoking status: Never Smoker   ??? Smokeless tobacco: Never Used   Substance and Sexual Activity   ??? Alcohol use: Not on file   ??? Drug use: Not on file   ??? Sexual activity: Not on file   Lifestyle   ??? Physical activity     Days per week: Not on file     Minutes per session: Not on file   ??? Stress: Not on file   Relationships   ??? Social Product manager on phone: Not on file     Gets together: Not on file     Attends religious service: Not on file     Active member of club or organization: Not on file     Attends meetings of clubs or organizations: Not on file     Relationship status: Not on file   ??? Intimate partner violence  Fear of current or ex partner: Not on file     Emotionally abused: Not on file     Physically abused: Not on file     Forced sexual activity: Not on file   Other Topics Concern   ??? Not on file   Social History Narrative   ??? Not on file     Current Outpatient Medications   Medication Sig Dispense Refill   ??? pantoprazole (PROTONIX) 40 mg tablet Take 40 mg by mouth daily.     ??? pramipexole (Mirapex) 1 mg tablet Take 1 mg by mouth three (3) times daily.     ??? PARoxetine (PAXIL) 30 mg tablet Take 30 mg by mouth daily.     ??? amitriptyline (ELAVIL) 25 mg tablet Take 25 mg by mouth nightly.     ??? gabapentin (NEURONTIN) 300 mg capsule Take 600 mg by mouth nightly. 2 tabs at bedtime     ??? telmisartan-hydroCHLOROthiazide (MICARDIS HCT) 80-25 mg per tablet Take 1 Tab by mouth daily.     ??? amLODIPine (NORVASC) 5 mg tablet Take 5 mg by mouth daily.       Current Facility-Administered Medications   Medication Dose Route Frequency Provider Last Rate Last Admin   ??? methylPREDNISolone acetate (DEPO-MEDROL) 40 mg/mL injection 40 mg  40 mg Intra artICUlar Danice Goltz, Carry Weesner May Uy, MD       ??? methylPREDNISolone acetate (DEPO-MEDROL)  40 mg/mL injection 40 mg  40 mg Intra artICUlar Danice Goltz, [place] Lusty May Uy, MD         Allergies   Allergen Reactions   ??? Morphine Anaphylaxis     Patient states she's not allergic         The medications were reviewed and updated in the medical record.  The past medical history, past surgical history, and family history were reviewed and updated in the medical record.    REVIEW OF SYSTEMS   ROS    PHYSICAL EXAM     Visit Vitals  BP 120/81 (BP 1 Location: Left upper arm, BP Patient Position: Sitting, BP Cuff Size: Adult)   Pulse 75   Ht 5\' 9"  (1.753 m)   Wt 173 lb (78.5 kg)   BMI 25.55 kg/m??       Physical Exam  Constitutional:       Appearance: Normal appearance.   Eyes:      Conjunctiva/sclera: Conjunctivae normal.   Pulmonary:      Effort: Pulmonary effort is normal.   Musculoskeletal:      Comments: Bilateral shoulders with good range of motion  No swelling or tenderness noted on elbows, wrists, MCP, PIP and DIP joints  Hips with good range of motion  Crepitus on both knees.  Bony swelling on right knee.  No effusion palpated  Tenderness on palpation of ankles but no soft tissue swelling or effusion noted   Skin:     Findings: No rash.   Neurological:      Mental Status: She is alert and oriented to person, place, and time.   Psychiatric:         Mood and Affect: Mood normal.         Behavior: Behavior normal.         Thought Content: Thought content normal.         Judgment: Judgment normal.             ASSESSMENT'PLAN   Diagnoses and all orders for this visit:    1.  Chronic pain of both knees  -     XR KNEE RT MIN 4 V; Future  -     XR KNEE LT MIN 4 V; Future  -     PR DRAIN/INJECT LARGE JOINT/BURSA  -     methylPREDNISolone acetate (DEPO-MEDROL) 40 mg/mL injection 40 mg  -     methylPREDNISolone acetate (DEPO-MEDROL) 40 mg/mL injection 40 mg    2. Fibromyalgia        64 year old female with history of fibromyalgia who is seen for evaluation of worsening lower extremity joint pain.  There is no frank synovitis  on exam of her joints.  Very low likelihood of underlying inflammatory arthritis.  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  Will get x-ray of joints.  I gave her cortisone injection.  Based on her history, I think she might also have underlying hypermobile joints contributing to her joint pain.  She has what sounds like joint subluxation on her shoulders and hips in the past.  Recommend adding muscle strengthening exercise to her regimen decides walking.    Recommend trying topical arthritis creams like Voltaren gel to her joints.  Will arrange follow-up with Rheumatology on an as-needed basis    Indications:   Bilateral knee pain    Procedure:  After consent was obtained, medial side of bilateral knee joint was prepped using Chlorprep.     Using 25 gauge needle, 40 mg Medrol and 2 ml 1% Lidocaine was injected to each joint      The procedure was well tolerated.  The patient is asked to continue to rest the joint for a few more days before resuming regular activities.  It may be more painful for the first 1-2 days.  Watch for fever, or increased swelling or persistent pain in the joint. Call or return to clinic prn if such symptoms occur or there is failure to improve as anticipated.    Thank you for this referral. Please do not hesitate to call my office for any question.      Leary Mcnulty May Celesta Aver, MD  09/18/2019

## 2019-09-18 NOTE — Progress Notes (Signed)
Self Administered or Unclassified Drugs  Self Administered or Unclassified Drugs  Medication Name: 1% Lidocaine  Expiration Date: 04/05/22  Dose: 4 cc  Manufacturer: Hikma  Lot#: [id number]  NDC#: 61607-371-06  Performed by/Witnessed by: Dr. Lunette Stands

## 2019-09-18 NOTE — Patient Instructions (Signed)
I gave you cortisone injection to your knees  X-ray of knees  You seem to have hypermobile joints which can cause early osteoarthritis  Ideally, you should do physical therapy  Try topical arthritis creams like Voltaren gel

## 2019-09-18 NOTE — Progress Notes (Signed)
Rheumatology Consult Note       [facility]   [place] [address]  [phone]    Please CC this note to : Dr Williemae Natter      Rheumatology History:        HPI     Karen Fernandez is a 64 year old female with history of hypertension and fibromyalgia who is seen urgently for worsening lower extremity pain.  I am seeing her at the request of Dr. Williemae Natter. Her regular PCP is Dr  Marton Redwood in [place].    She was diagnosed with fibromyalgia around 2009. She recalls seeing a rheumatology nurse practitioner who confirmed the diagnosis.  Does not recall being advised that she has an underlying rheumatic autoimmune disease causing her pain.  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  She reports that for several months, she has worsening pain her hips, knees and ankles.  She reports having stiffness after prolonged sitting but she also has pain after prolonged walking.  She usually likes to walk for exercise.  No significant swelling noted.  Takes Aleve and Tylenol which does not provide significant relief for her knees but does provide relief for the rest of her joints.    She has chronic dry eyes, dry mouth.  She has alternating episodes of diarrhea and constipation.  She denies any photosensitive skin rash.  Denies any recent infection or unexplained weight loss    No known family history of rheumatic autoimmune disease.      Past Medical History:   Diagnosis Date   ??? Allergic rhinitis    ??? Hypercholesterolemia    ??? Hypertension    ??? Migraine    ??? RLS (restless legs syndrome)      No past surgical history on file.  No family history on file.  Social History     Socioeconomic History   ??? Marital status: MARRIED     Spouse name: Not on file   ??? Number of children: Not on file   ??? Years of education: Not on file   ??? Highest education level: Not on file   Occupational History   ??? Not on file   Social Needs   ??? Financial resource strain:  Not on file   ??? Food insecurity     Worry: Not on file     Inability: Not on file   ??? Transportation needs     Medical: Not on file     Non-medical: Not on file   Tobacco Use   ??? Smoking status: Never Smoker   ??? Smokeless tobacco: Never Used   Substance and Sexual Activity   ??? Alcohol use: Not on file   ??? Drug use: Not on file   ??? Sexual activity: Not on file   Lifestyle   ??? Physical activity     Days per week: Not on file     Minutes per session: Not on file   ??? Stress: Not on file   Relationships   ??? Social Product manager on phone: Not on file     Gets together: Not on file     Attends religious service: Not on file     Active member of club or organization: Not on file     Attends meetings of clubs or organizations: Not on file     Relationship status: Not on file   ??? Intimate partner violence  Fear of current or ex partner: Not on file     Emotionally abused: Not on file     Physically abused: Not on file     Forced sexual activity: Not on file   Other Topics Concern   ??? Not on file   Social History Narrative   ??? Not on file     Current Outpatient Medications   Medication Sig Dispense Refill   ??? pantoprazole (PROTONIX) 40 mg tablet Take 40 mg by mouth daily.     ??? pramipexole (Mirapex) 1 mg tablet Take 1 mg by mouth three (3) times daily.     ??? PARoxetine (PAXIL) 30 mg tablet Take 30 mg by mouth daily.     ??? amitriptyline (ELAVIL) 25 mg tablet Take 25 mg by mouth nightly.     ??? gabapentin (NEURONTIN) 300 mg capsule Take 600 mg by mouth nightly. 2 tabs at bedtime     ??? telmisartan-hydroCHLOROthiazide (MICARDIS HCT) 80-25 mg per tablet Take 1 Tab by mouth daily.     ??? amLODIPine (NORVASC) 5 mg tablet Take 5 mg by mouth daily.       Current Facility-Administered Medications   Medication Dose Route Frequency Provider Last Rate Last Admin   ??? methylPREDNISolone acetate (DEPO-MEDROL) 40 mg/mL injection 40 mg  40 mg Intra artICUlar ONCE Dallis Darden May Uy, MD       ??? methylPREDNISolone acetate (DEPO-MEDROL)  40 mg/mL injection 40 mg  40 mg Intra artICUlar ONCE Jadavion Spoelstra May Uy, MD         Allergies   Allergen Reactions   ??? Morphine Anaphylaxis     Patient states she's not allergic         The medications were reviewed and updated in the medical record.  The past medical history, past surgical history, and family history were reviewed and updated in the medical record.    REVIEW OF SYSTEMS   ROS    PHYSICAL EXAM     Visit Vitals  BP 120/81 (BP 1 Location: Left upper arm, BP Patient Position: Sitting, BP Cuff Size: Adult)   Pulse 75   Ht 5' 9" (1.753 m)   Wt 173 lb (78.5 kg)   BMI 25.55 kg/m??       Physical Exam  Constitutional:       Appearance: Normal appearance.   Eyes:      Conjunctiva/sclera: Conjunctivae normal.   Pulmonary:      Effort: Pulmonary effort is normal.   Musculoskeletal:      Comments: Bilateral shoulders with good range of motion  No swelling or tenderness noted on elbows, wrists, MCP, PIP and DIP joints  Hips with good range of motion  Crepitus on both knees.  Bony swelling on right knee.  No effusion palpated  Tenderness on palpation of ankles but no soft tissue swelling or effusion noted   Skin:     Findings: No rash.   Neurological:      Mental Status: She is alert and oriented to person, place, and time.   Psychiatric:         Mood and Affect: Mood normal.         Behavior: Behavior normal.         Thought Content: Thought content normal.         Judgment: Judgment normal.             ASSESSMENT'PLAN   Diagnoses and all orders for this visit:    1.   Chronic pain of both knees  -     XR KNEE RT MIN 4 V; Future  -     XR KNEE LT MIN 4 V; Future  -     PR DRAIN/INJECT LARGE JOINT/BURSA  -     methylPREDNISolone acetate (DEPO-MEDROL) 40 mg/mL injection 40 mg  -     methylPREDNISolone acetate (DEPO-MEDROL) 40 mg/mL injection 40 mg    2. Fibromyalgia        64 year old female with history of fibromyalgia who is seen for evaluation of worsening lower extremity joint pain.  There is no frank synovitis  on exam of her joints.  Very low likelihood of underlying inflammatory arthritis.  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  Will get x-ray of joints.  I gave her cortisone injection.  Based on her history, I think she might also have underlying hypermobile joints contributing to her joint pain.  She has what sounds like joint subluxation on her shoulders and hips in the past.  Recommend adding muscle strengthening exercise to her regimen decides walking.    Recommend trying topical arthritis creams like Voltaren gel to her joints.  Will arrange follow-up with Rheumatology on an as-needed basis    Indications:   Bilateral knee pain    Procedure:  After consent was obtained, medial side of bilateral knee joint was prepped using Chlorprep.     Using 25 gauge needle, 40 mg Medrol and 2 ml 1% Lidocaine was injected to each joint      The procedure was well tolerated.  The patient is asked to continue to rest the joint for a few more days before resuming regular activities.  It may be more painful for the first 1-2 days.  Watch for fever, or increased swelling or persistent pain in the joint. Call or return to clinic prn if such symptoms occur or there is failure to improve as anticipated.    Thank you for this referral. Please do not hesitate to call my office for any question.      Leary Mcnulty May Celesta Aver, MD  09/18/2019

## 2019-09-18 NOTE — Progress Notes (Signed)
Self Administered or Unclassified Drugs  Self Administered or Unclassified Drugs  Medication Name: 1% Lidocaine  Expiration Date: 04/05/22  Dose: 4 cc  Manufacturer: Hikma  Lot#: [id number]  NDC#: 63323-485-03  Performed by/Witnessed by: Dr. Chua

## 2020-01-08 ENCOUNTER — Ambulatory Visit: Attending: Gastroenterology | Primary: Student in an Organized Health Care Education/Training Program

## 2020-07-14 IMAGING — CT CT OF THE RIGHT SHOULDER WITHOUT CONTRAST
1 of 2 series · 9 of 14 positions shown, 12 images · non-contrast
Comparison: 04/04/2018 radiographs

CLINICAL DATA: Right shoulder pain, preoperative for total shoulder
replacement.

EXAM:
CT OF THE UPPER RIGHT EXTREMITY WITHOUT CONTRAST
TECHNIQUE: Multidetector CT imaging of the upper right extremity was performed
according to the standard protocol.

[Series 3: soft thin · axial · 0.46mm/px · z∈[-205,-73]mm · 9 of 275 slices shown, 12 images]
[im 28/275  soft-tissue]
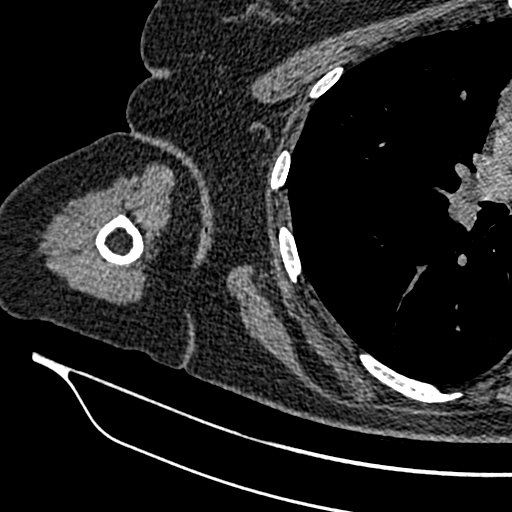
[im 28/275  bone]
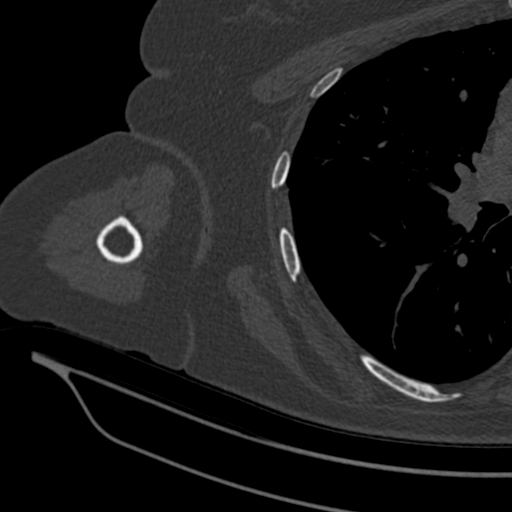
[im 55/275  bone]
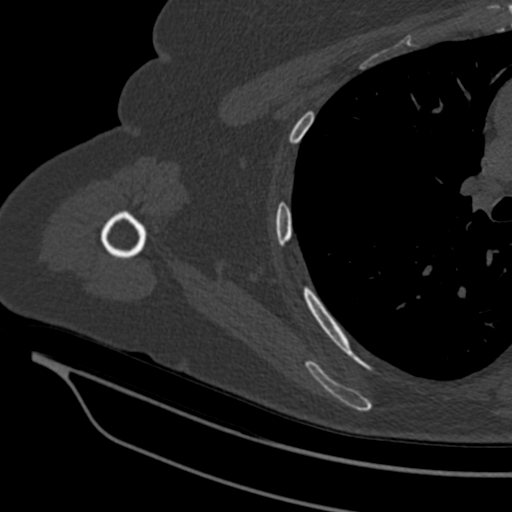
[im 83/275  bone]
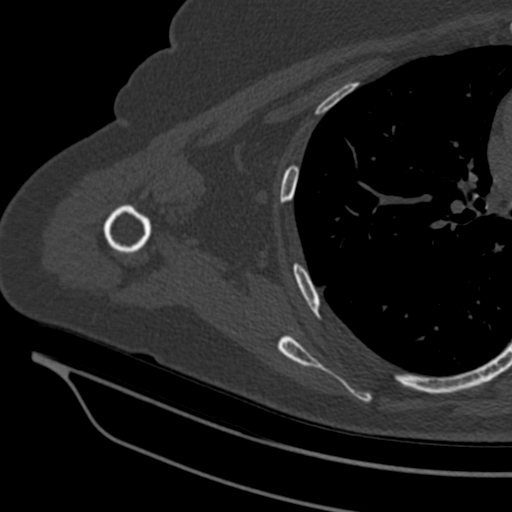
[im 110/275  bone]
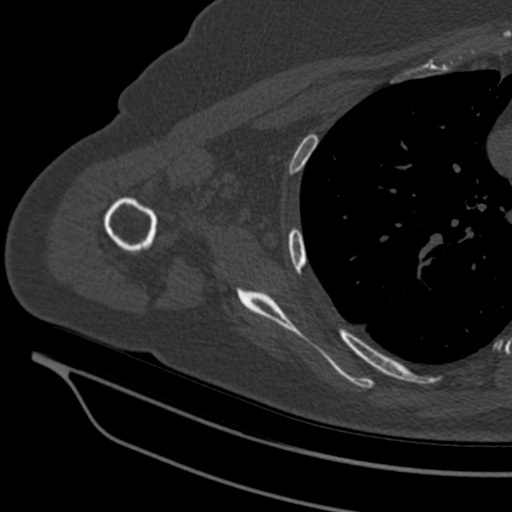
[im 138/275  soft-tissue]
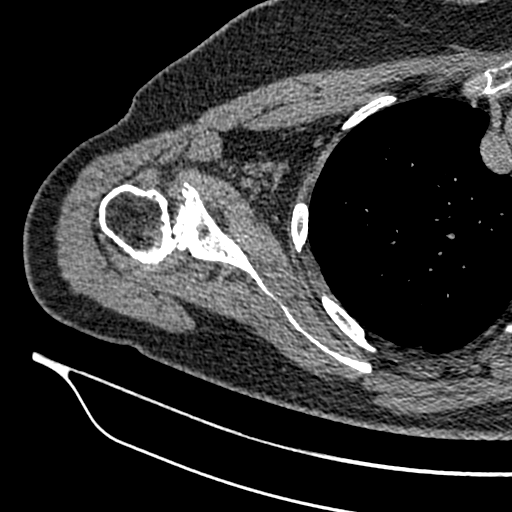
[im 138/275  bone]
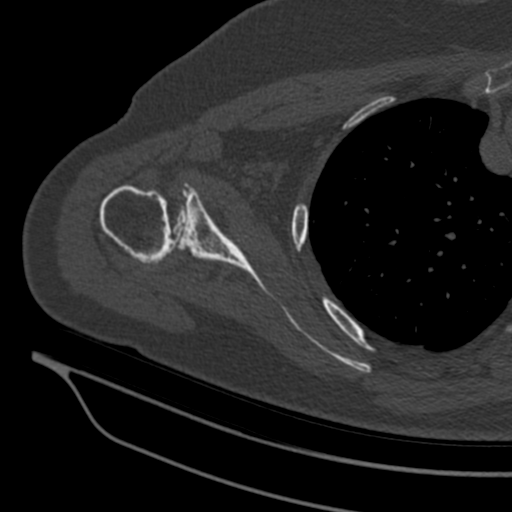
[im 165/275  bone]
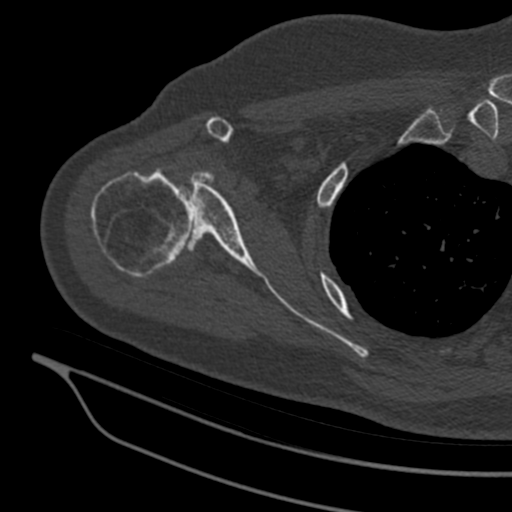
[im 192/275  bone]
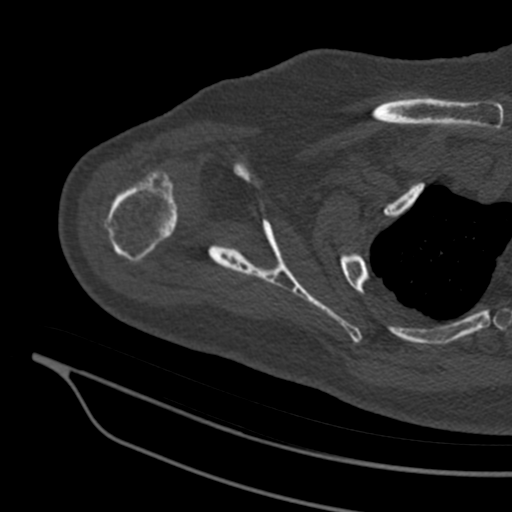
[im 220/275  bone]
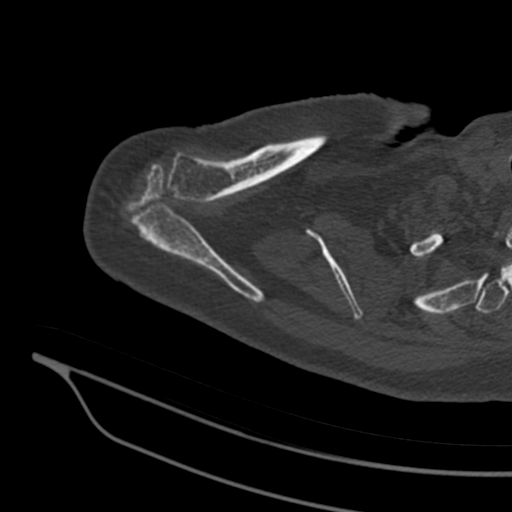
[im 247/275  soft-tissue]
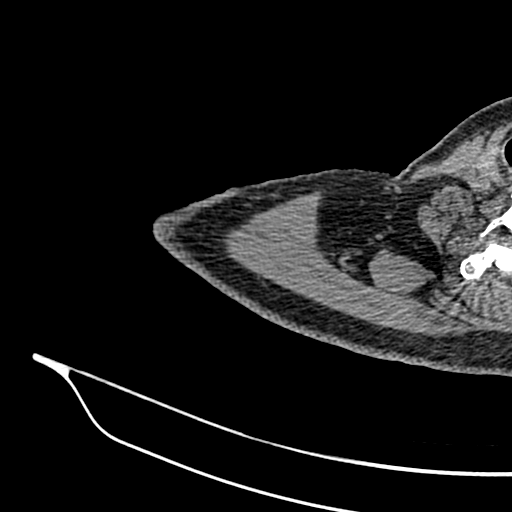
[im 247/275  bone]
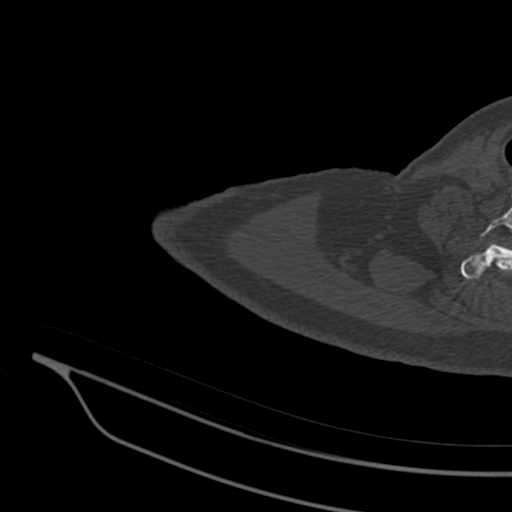

[9 of 14 positions shown; findings below may reference images not displayed]

FINDINGS: Bones/Joint/Cartilage

Markedly severe articular space narrowing in the glenohumeral joint
with marked spurring, mild flattening/subsidence of the articular
portion of the humeral head, and some flattening of the glenoid.
Subcortical eburnation noted with full-thickness chondral loss.

Mild superior humeral head displacement due to thinning of the
rotator cuff musculature.

Mesoacromial os acromiale.

A do not appreciated a significant joint effusion. There is no
moderate or large amount of fluid in the subacromial subdeltoid
bursa.

Ligaments

Suboptimally assessed by CT.

Muscles and Tendons

No significant rotator cuff muscular atrophy. There is likely some
thinning of the supraspinatus tendon. CT is not considered sensitive
for detecting rotator cuff tears.

Soft tissues

Mild right apical pleuroparenchymal scarring in the lung.
IMPRESSION: 1. Severe osteoarthritis of the right glenohumeral joint with
prominent spurring, subcortical eburnation, full-thickness loss of
articular cartilage thickness, flattening of the glenoid, and slight
subsidence of the articular portion of the humeral head.
2. Suspected thinning of the supraspinatus tendon. No rotator cuff
muscular atrophy. Sensitivity for full-thickness partial width tears
of the rotator cuff is low on non arthrogram CT.
3. Mesoacromial os acromiale.

## 2020-07-28 ENCOUNTER — Other Ambulatory Visit: Payer: Self-pay | Admitting: Internal Medicine

## 2020-07-28 DIAGNOSIS — Z1231 Encounter for screening mammogram for malignant neoplasm of breast: Secondary | ICD-10-CM

## 2020-07-29 ENCOUNTER — Other Ambulatory Visit: Payer: Self-pay

## 2020-07-29 ENCOUNTER — Ambulatory Visit
Admission: RE | Admit: 2020-07-29 | Discharge: 2020-07-29 | Disposition: A | Discharge status: Home / Self Care | Payer: Medicare Other | Source: Ambulatory Visit | Attending: Internal Medicine | Admitting: Internal Medicine

## 2020-07-29 DIAGNOSIS — Z1231 Encounter for screening mammogram for malignant neoplasm of breast: Secondary | ICD-10-CM

## 2021-04-06 ENCOUNTER — Emergency Department (HOSPITAL_COMMUNITY)
Admission: EM | Admit: 2021-04-06 | Discharge: 2021-04-06 | Disposition: A | Discharge status: Home / Self Care | Payer: Medicare Other | Attending: Emergency Medicine | Admitting: Emergency Medicine

## 2021-04-06 ENCOUNTER — Other Ambulatory Visit: Payer: Self-pay

## 2021-04-06 ENCOUNTER — Emergency Department (HOSPITAL_COMMUNITY): Payer: Medicare Other

## 2021-04-06 ENCOUNTER — Encounter (HOSPITAL_COMMUNITY): Payer: Self-pay | Admitting: Emergency Medicine

## 2021-04-06 DIAGNOSIS — S73015A Posterior dislocation of left hip, initial encounter: Secondary | ICD-10-CM | POA: Insufficient documentation

## 2021-04-06 DIAGNOSIS — Z96642 Presence of left artificial hip joint: Secondary | ICD-10-CM | POA: Insufficient documentation

## 2021-04-06 DIAGNOSIS — X501XXA Overexertion from prolonged static or awkward postures, initial encounter: Secondary | ICD-10-CM | POA: Diagnosis not present

## 2021-04-06 DIAGNOSIS — Z96611 Presence of right artificial shoulder joint: Secondary | ICD-10-CM | POA: Diagnosis not present

## 2021-04-06 DIAGNOSIS — S79912A Unspecified injury of left hip, initial encounter: Secondary | ICD-10-CM | POA: Diagnosis present

## 2021-04-06 DIAGNOSIS — R791 Abnormal coagulation profile: Secondary | ICD-10-CM | POA: Insufficient documentation

## 2021-04-06 DIAGNOSIS — Z79899 Other long term (current) drug therapy: Secondary | ICD-10-CM | POA: Insufficient documentation

## 2021-04-06 DIAGNOSIS — I10 Essential (primary) hypertension: Secondary | ICD-10-CM | POA: Diagnosis not present

## 2021-04-06 LAB — CBC WITH DIFFERENTIAL/PLATELET
Abs Immature Granulocytes: 0.02 10*3/uL (ref 0.00–0.07)
Basophils Absolute: 0 10*3/uL (ref 0.0–0.1)
Basophils Relative: 1 %
Eosinophils Absolute: 0.1 10*3/uL (ref 0.0–0.5)
Eosinophils Relative: 1 %
HCT: 38.5 % (ref 36.0–46.0)
Hemoglobin: 13.1 g/dL (ref 12.0–15.0)
Immature Granulocytes: 0 %
Lymphocytes Relative: 13 %
Lymphs Abs: 1 10*3/uL (ref 0.7–4.0)
MCH: 31.3 pg (ref 26.0–34.0)
MCHC: 34 g/dL (ref 30.0–36.0)
MCV: 91.9 fL (ref 80.0–100.0)
Monocytes Absolute: 0.5 10*3/uL (ref 0.1–1.0)
Monocytes Relative: 6 %
Neutro Abs: 5.8 10*3/uL (ref 1.7–7.7)
Neutrophils Relative %: 79 %
Platelets: 299 10*3/uL (ref 150–400)
RBC: 4.19 MIL/uL (ref 3.87–5.11)
RDW: 13 % (ref 11.5–15.5)
WBC: 7.4 10*3/uL (ref 4.0–10.5)
nRBC: 0 % (ref 0.0–0.2)

## 2021-04-06 LAB — BASIC METABOLIC PANEL
Anion gap: 4 — ABNORMAL LOW (ref 5–15)
BUN: 16 mg/dL (ref 8–23)
CO2: 27 mmol/L (ref 22–32)
Calcium: 8.5 mg/dL — ABNORMAL LOW (ref 8.9–10.3)
Chloride: 106 mmol/L (ref 98–111)
Creatinine, Ser: 0.87 mg/dL (ref 0.44–1.00)
GFR, Estimated: >60 mL/min (ref >60)
Glucose, Bld: 107 mg/dL — ABNORMAL HIGH (ref 70–99)
Potassium: 3.8 mmol/L (ref 3.5–5.1)
Sodium: 137 mmol/L (ref 135–145)

## 2021-04-06 LAB — PROTIME-INR
INR: 1 (ref 0.8–1.2)
Prothrombin Time: 12.7 seconds (ref 11.4–15.2)

## 2021-04-06 MED ORDER — FENTANYL CITRATE PF 50 MCG/ML IJ SOSY
100.0000 ug | PREFILLED_SYRINGE | Freq: Once | INTRAMUSCULAR | Status: AC
  Administered 2021-04-06: 100 ug via INTRAVENOUS
  Filled 2021-04-06: qty 2

## 2021-04-06 MED ORDER — PROPOFOL 10 MG/ML IV BOLUS
INTRAVENOUS | Status: AC | PRN
  Administered 2021-04-06 (×2): 38.1 mg via INTRAVENOUS

## 2021-04-06 MED ORDER — KETAMINE HCL 50 MG/5ML IJ SOSY
0.5000 mg/kg | PREFILLED_SYRINGE | Freq: Once | INTRAMUSCULAR | Status: AC
  Administered 2021-04-06: 38 mg via INTRAVENOUS
  Filled 2021-04-06: qty 5

## 2021-04-06 MED ORDER — HYDROCODONE-ACETAMINOPHEN 5-325 MG PO TABS
1.0000 | ORAL_TABLET | Freq: Once | ORAL | Status: AC
  Administered 2021-04-06: 1 via ORAL
  Filled 2021-04-06: qty 1

## 2021-04-06 MED ORDER — PROPOFOL 10 MG/ML IV BOLUS
0.5000 mg/kg | Freq: Once | INTRAVENOUS | Status: AC
  Administered 2021-04-06: 38.1 mg via INTRAVENOUS
  Filled 2021-04-06: qty 20

## 2021-04-06 MED ORDER — HYDROCODONE-ACETAMINOPHEN 5-325 MG PO TABS: 1.0000 | ORAL_TABLET | Freq: Four times a day (QID) | ORAL | 0 refills | Status: DC | PRN

## 2021-04-06 NOTE — Discharge Instructions (Addendum)
1.  Wear your knee immobilizer at all times except when you are showering.  Do not cross your legs.  Do not bend over.  Do not try to twist or rotate on the hip. 2.  Follow-up with Dr. Lequita Halt in about a week. 3.  You may take 1-2 hydrocodone tablets every 6 hours if needed for pain.  As soon his pain has improved, switch to extra strength Tylenol for pain control.

## 2021-04-06 NOTE — Progress Notes (Signed)
Orthopedic Tech Progress Note Patient Details:  Brenda Short 08-19-1955 [id number]  Ortho Devices Type of Ortho Device: Knee Sleeve Ortho Device/Splint Location: LLE Ortho Device/Splint Interventions: Ordered, Application, Adjustment   Post Interventions Patient Tolerated: Well Instructions Provided: Adjustment of device, Care of device  [place] A Gerilyn Pilgrim 04/06/2021, 3:23 PM

## 2021-04-06 NOTE — ED Provider Notes (Signed)
[facility]-EMERGENCY DEPT Provider Note   CSN: [id number] Arrival date & time: 04/06/21  1220     History No chief complaint on file.   Brenda Short is a 65 y.o. female.  HPI Patient has history of left hip replacement approximately 7 years ago.  She has not had any complications.  No prior dislocations patient reports that she was stooped over for a rather long period of time trying to arrange something on the floor.  She reports the hip spontaneously gave way on the left.  She reports that she was able to grab onto a piece of furniture to support herself and did not collapse to the ground.  However, the hip and leg were so painful and in such an awkward position she could not put any weight on them.  She called EMS who transported.  She was given fentanyl in route which she reports was helpful for pain but is wearing off now.  The only position of comfort for her is with the hip significantly flexed and the lower leg supported.  Patient reports she last ate yesterday evening.  She has had some black coffee today.  She has had previous sedation with propofol without adverse effect.  She has in the past experienced nausea with narcotic pain medication.    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/2018   Epistaxis, recurrent 09/07/2013   Postop Hyponatremia 11/11/2011   Postop Hypokalemia 11/11/2011   OA (osteoarthritis) of hip 11/09/2011   Preop cardiovascular exam 10/07/2011   Chest pain 10/07/2011   Mitral valve prolapse 10/07/2011   Hypertension 10/07/2011     Past Surgical History:  Procedure Laterality Date   ABDOMINAL HYSTERECTOMY     BREAST SURGERY     DUCTS REMOVED FROM RT BREAST 2008   HIP ARTHROSCOPY     2010 L HIP ARTHROSCOPY    HIP ARTHROSCOPY  04/20/2011   Procedure: ARTHROSCOPY HIP;  Surgeon: Gus Rankin Aluisio;  Location: [facility]S;  Service: Orthopedics;  Laterality: Left;  debridement labral tear, chondroplasty   JOINT REPLACEMENT     TOTAL HIP ARTHROPLASTY  11/09/2011   Procedure: TOTAL HIP ARTHROPLASTY;  Surgeon: Loanne Drilling, MD;  Location: [facility];  Service: Orthopedics;  Laterality: Left;   TOTAL SHOULDER ARTHROPLASTY Right 12/25/2018   TOTAL SHOULDER ARTHROPLASTY Right 12/25/2018   Procedure: RIGHT TOTAL SHOULDER ARTHROPLASTY;  Surgeon: Cammy Copa, MD;  Location: [facility] OR;  Service: Orthopedics;  Laterality: Right;   TUBAL LIGATION     1986     OB History   No obstetric history on file.     Family History  Problem Relation Age of Onset   Heart disease Other        No family history of    Social History   Tobacco Use   Smoking status: Never   Smokeless tobacco: Never  Vaping Use   Vaping Use: Never used  Substance Use Topics   Alcohol use: Yes    Alcohol/week:  7.0 standard drinks    Types: 7 Glasses of wine per week    Comment: 1-2 glasses of wine per day   Drug use: No    Home Medications Prior to Admission medications   Medication Sig Start Date End Date Taking? Authorizing Provider  albuterol (PROVENTIL HFA;VENTOLIN HFA) 108 (90 BASE) MCG/ACT inhaler Inhale into the lungs every 6 (six) hours as needed for wheezing or shortness of breath.   Yes [provider]  amitriptyline (ELAVIL) 25 MG tablet Take 25 mg by mouth at bedtime.    Yes [provider]  amoxicillin (AMOXIL) 500 MG tablet Take 4 tablets (2 g) 30 mins to an hour prior to dental procedure. 03/22/19  Yes Magnant, Charles L, PA-C  atenolol (TENORMIN) 25 MG tablet Take 25 mg by mouth every morning.     Yes [provider]  Azelastine HCl 0.15 % SOLN Place 1 spray into the nose 2 (two) times daily as needed. For allergies   Yes [provider]  docusate sodium (COLACE) 100 MG capsule Take 100 mg by mouth 2 (two) times daily as needed. CONSTIPATION     Yes [provider]  gabapentin (NEURONTIN) 300 MG capsule Take 300-600 mg by mouth See admin instructions. 300 mg in the morning, and 600 mg at bedtime 04/01/18  Yes [provider]  HYDROcodone-acetaminophen (NORCO/VICODIN) 5-325 MG tablet Take 1-2 tablets by mouth every 6 (six) hours as needed for moderate pain or severe pain. 04/06/21  Yes Arby Barrette, MD  naproxen sodium (ALEVE) 220 MG tablet Take 440 mg by mouth daily as needed (pain).   Yes [provider]  omeprazole (PRILOSEC) 40 MG capsule Take 40 mg by mouth daily before breakfast.   Yes [provider]  PARoxetine (PAXIL) 30 MG tablet Take 30 mg by mouth at bedtime.    Yes [provider]  polyethylene glycol powder (GLYCOLAX/MIRALAX) 17 GM/SCOOP powder Take 17 g by mouth daily as needed for mild constipation.   Yes [provider]  pramipexole (MIRAPEX) 1 MG tablet Take 2 mg by mouth at bedtime.  03/16/18  Yes [provider]  rizatriptan (MAXALT) 10 MG tablet Take 10 mg by mouth daily as needed for migraine. 11/18/15  Yes [provider]  telmisartan-hydrochlorothiazide (MICARDIS HCT) 80-25 MG tablet Take 1 tablet by mouth daily. 03/16/18  Yes [provider]  trolamine salicylate (ASPERCREME) 10 % cream Apply 1 application topically as needed for muscle pain.   Yes [provider]    Allergies    Morphine and related  Review of Systems   Review of Systems 10 systems reviewed and negative except as per HPI Physical Exam Updated Vital Signs BP 111/84   Pulse 83   Temp 97.7 F (36.5 C) (Oral)   Resp 11   Ht  (1.778 m)   Wt 76.2 kg   SpO2 97%   BMI 24.11 kg/m   Physical  Exam Constitutional:      Comments: Alert nontoxic clear mental status  HENT:     Head: Normocephalic and atraumatic.     Mouth/Throat:     Mouth: Mucous membranes are moist.     Pharynx: Oropharynx is clear.  Eyes:     Extraocular Movements: Extraocular movements intact.  Cardiovascular:     Rate and Rhythm: Normal rate and regular rhythm.  Pulmonary:     Effort: Pulmonary effort is normal.     Breath sounds: Normal breath sounds.  Abdominal:  General: There is no distension.     Palpations: Abdomen is soft.     Tenderness: There is no abdominal tenderness. There is no guarding.  Musculoskeletal:     Cervical back: Neck supple.     Comments: Left hip is deeply flexed and supported on a pile of pillows.  Dorsalis pedis pulse 2+ and strong.  The foot is warm and dry.  No injuries to other extremities.  Skin:    General: Skin is warm and dry.  Neurological:     General: No focal deficit present.     Mental Status: She is oriented to person, place, and time.     Motor: No weakness.     Coordination: Coordination normal.  Psychiatric:        Mood and Affect: Mood normal.    ED Results / Procedures / Treatments   Labs (all labs ordered are listed, but only abnormal results are displayed) Labs Reviewed  BASIC METABOLIC PANEL - Abnormal; Notable for the following components:      Result Value   Glucose, Bld 107 (*)    Calcium 8.5 (*)    Anion gap 4 (*)    All other components within normal limits  CBC WITH DIFFERENTIAL/PLATELET  PROTIME-INR    EKG None  Radiology DG Pelvis Portable  Result Date: 04/06/2021 CLINICAL DATA:  Status post left hip reduction EXAM: PORTABLE PELVIS 1-2 VIEWS COMPARISON:  Pelvic x-ray 11/09/2011 FINDINGS: No acute fracture or dislocation identified. Total left hip prosthesis appears in place. Chronic osteitis pubis noted. IMPRESSION: No acute fracture or dislocation. Electronically Signed   By: Jannifer Hick M.D.   On: 04/06/2021 14:34    DG Hip Unilat With Pelvis 2-3 Views Left  Result Date: 04/06/2021 CLINICAL DATA:  Trauma, fall EXAM: DG HIP (WITH OR WITHOUT PELVIS) 2-3V LEFT COMPARISON:  11/09/2011 FINDINGS: There is posterior dislocation of femoral head prosthesis in relation to the acetabular cup. No definite fracture lines are seen. IMPRESSION: Posterior dislocation of left femoral head prosthesis in relation to the acetabular cup. Electronically Signed   By: Ernie Avena M.D.   On: 04/06/2021 13:19    Procedures .Ortho Injury Treatment  Date/Time: 04/06/2021 2:11 PM Performed by: Arby Barrette, MD Authorized by: Arby Barrette, MD   Consent:    Consent obtained:  Verbal   Consent given by:  Patient   Risks discussed:  Fracture, nerve damage, restricted joint movement, vascular damage, stiffness, recurrent dislocation and irreducible dislocationInjury location: hip Location details: left hip Injury type: dislocation Dislocation type: posterior Spontaneous dislocation: yes Prosthesis: yes Pre-procedure neurovascular assessment: neurovascularly intact Pre-procedure distal perfusion: normal Pre-procedure neurological function: normal Pre-procedure range of motion: reduced  Anesthesia: Local anesthesia used: no  Patient sedated: Yes. Refer to sedation procedure documentation for details of sedation. Manipulation performed: yes Reduction method: Allis maneuver Reduction successful: yes X-ray confirmed reduction: yes Immobilization: brace Splint type: long leg Splint Applied by: ED Tech Post-procedure neurovascular assessment: post-procedure neurovascularly intact Post-procedure distal perfusion: normal Post-procedure neurological function: normal Post-procedure range of motion: normal   .Sedation  Date/Time: 04/06/2021 2:15 PM Performed by: Arby Barrette, MD Authorized by: Arby Barrette, MD   Consent:    Consent obtained:  Verbal   Consent given by:  Patient   Risks discussed:   Allergic reaction, prolonged hypoxia resulting in organ damage, prolonged sedation necessitating reversal, dysrhythmia, inadequate sedation, respiratory compromise necessitating ventilatory assistance and intubation, nausea and vomiting Universal protocol:    Immediately prior to procedure, a time out  was called: yes     Patient identity confirmed:  Arm band and verbally with patient Indications:    Procedure performed:  Dislocation reduction   Procedure necessitating sedation performed by:  Physician performing sedation Pre-sedation assessment:    Time since last food or drink:  11pm yesterday   ASA classification: class 2 - patient with mild systemic disease     Mouth opening:  3 or more finger widths   Thyromental distance:  4 finger widths   Mallampati score:  III - soft palate, base of uvula visible   Neck mobility: normal     Pre-sedation assessments completed and reviewed: pre-procedure airway patency not reviewed, pre-procedure cardiovascular function not reviewed, pre-procedure hydration status not reviewed, pre-procedure mental status not reviewed, pre-procedure nausea and vomiting status not reviewed, pre-procedure pain level not reviewed, pre-procedure respiratory function not reviewed and pre-procedure temperature not reviewed   Immediate pre-procedure details:    Reassessment: Patient reassessed immediately prior to procedure     Reviewed: vital signs, relevant labs/tests and NPO status     Verified: bag valve mask available, emergency equipment available, intubation equipment available, IV patency confirmed, oxygen available and reversal medications available   Procedure details (see MAR for exact dosages):    Preoxygenation:  Nasal cannula   Sedation:  Ketamine and propofol   Intended level of sedation: deep   Intra-procedure monitoring:  Blood pressure monitoring, cardiac monitor, continuous pulse oximetry, frequent LOC assessments, frequent vital sign checks and continuous  capnometry   Intra-procedure events: hypoxia and respiratory depression     Intra-procedure management:  Airway repositioning and BVM ventilation   Total Provider sedation time (minutes):  15 Post-procedure details:    Post-sedation assessment completed:  04/06/2021 2:17 PM   Attendance: Constant attendance by certified staff until patient recovered     Recovery: Patient returned to pre-procedure baseline     Post-sedation assessments completed and reviewed: airway patency, cardiovascular function, hydration status, mental status, nausea/vomiting, pain level and respiratory function     Patient is stable for discharge or admission: yes     Procedure completion:  Tolerated well, no immediate complications   Medications Ordered in ED Medications  fentaNYL (SUBLIMAZE) injection 100 mcg (100 mcg Intravenous Given 04/06/21 1300)  propofol (DIPRIVAN) 10 mg/mL bolus/IV push 38.1 mg (38.1 mg Intravenous Given 04/06/21 1400)  ketamine 50 mg in normal saline 5 mL (10 mg/mL) syringe (38 mg Intravenous Given 04/06/21 1357)  propofol (DIPRIVAN) 10 mg/mL bolus/IV push (38.1 mg Intravenous Given 04/06/21 1403)  HYDROcodone-acetaminophen (NORCO/VICODIN) 5-325 MG per tablet 1 tablet (1 tablet Oral Given 04/06/21 1519)    ED Course  I have reviewed the triage vital signs and the nursing notes.  Pertinent labs & imaging results that were available during my care of the patient were reviewed by me and considered in my medical decision making (see chart for details).    MDM Rules/Calculators/A&P                           Consult: Reviewed with Dr. Despina Hick.  Patient will follow-up in the office in approximately week.  Patient has history of a left hip prosthesis that has been stable for 7 years.  She was bending over and it spontaneously dislocated.  No other associated injuries.  Patient is neurovascularly intact.  Hip reduced under sedation.  Patient tolerated very well.  She is comfortable now alert and at  baseline.  Stable for discharge.  Home  management and follow-up plan reviewed. Final Clinical Impression(s) / ED Diagnoses Final diagnoses:  Posterior dislocation of left hip, initial encounter [facility])    Rx / DC Orders ED Discharge Orders          Ordered    HYDROcodone-acetaminophen (NORCO/VICODIN) 5-325 MG tablet  Every 6 hours PRN        04/06/21 1528             Arby Barrette, MD 04/06/21 1531

## 2021-04-06 NOTE — ED Notes (Addendum)
Provided w/ food and drink. Titrated to room air. Ortho tech called for knee immobilizer.

## 2021-04-06 NOTE — Progress Notes (Signed)
RT NOTE:  RT was present during conscious sedation. Vitals are stable, RT will continue to monitor.

## 2021-04-06 NOTE — ED Notes (Signed)
Respiratory and ortho called for conscious sedation.

## 2021-04-06 NOTE — ED Triage Notes (Addendum)
Patient had previous L hip replacement, was leaning over in the closet and felt her hip become unstable and slide out of place per EMS. Reports L hip pain, unable to straighten leg. Pedal pulses intact. Non-ambulatory. EMS carried down the steps. EMS gave a total of 250 mg IV Fentanyl IV.

## 2021-04-06 NOTE — ED Notes (Signed)
X-ray at bedside

## 2021-04-06 NOTE — Progress Notes (Signed)
Orthopedic Tech Progress Note Patient Details:  Brenda Short 1956-01-19 [id number]  Patient ID: [name], female   DOB: [birth year], 65 y.o.   MRN: [id number] Present for hip reduction. Darleen Crocker 04/06/2021, 2:17 PM

## 2021-06-03 LAB — IFOBT (OCCULT BLOOD): IFOBT: NEGATIVE

## 2021-08-30 ENCOUNTER — Other Ambulatory Visit: Payer: Self-pay

## 2021-08-30 ENCOUNTER — Encounter (HOSPITAL_BASED_OUTPATIENT_CLINIC_OR_DEPARTMENT_OTHER): Payer: Self-pay | Admitting: Obstetrics & Gynecology

## 2021-08-30 ENCOUNTER — Ambulatory Visit (INDEPENDENT_AMBULATORY_CARE_PROVIDER_SITE_OTHER): Payer: Medicare Other | Admitting: Obstetrics & Gynecology

## 2021-08-30 ENCOUNTER — Other Ambulatory Visit (HOSPITAL_COMMUNITY)
Admission: RE | Admit: 2021-08-30 | Discharge: 2021-08-30 | Disposition: A | Discharge status: Home / Self Care | Payer: Medicare Other | Source: Ambulatory Visit | Attending: Obstetrics & Gynecology | Admitting: Obstetrics & Gynecology

## 2021-08-30 VITALS — BP 125/89 | HR 88 | Ht 68.0 in | Wt 166.8 lb

## 2021-08-30 DIAGNOSIS — Z124 Encounter for screening for malignant neoplasm of cervix: Secondary | ICD-10-CM

## 2021-08-30 DIAGNOSIS — N950 Postmenopausal bleeding: Secondary | ICD-10-CM | POA: Diagnosis not present

## 2021-08-30 DIAGNOSIS — N952 Postmenopausal atrophic vaginitis: Secondary | ICD-10-CM

## 2021-08-30 DIAGNOSIS — Z1231 Encounter for screening mammogram for malignant neoplasm of breast: Secondary | ICD-10-CM

## 2021-08-30 DIAGNOSIS — Z1151 Encounter for screening for human papillomavirus (HPV): Secondary | ICD-10-CM | POA: Insufficient documentation

## 2021-08-30 MED ORDER — ESTRADIOL 0.1 MG/GM VA CREA: TOPICAL_CREAM | VAGINAL | 3 refills | Status: DC

## 2021-08-30 NOTE — Progress Notes (Signed)
66 y.o. G3P3 Married White or Caucasian female here for new patient appointment.  H/o vaginal spotting that she reports she thinks was related to vaginal irritation.  This was just with wiping.  The last time she noticed this was 02/2022.   ? 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   ? ?Denies vaginal bleeding.  H/o TVH.  Has been on HRT in the past.   ? ?Health Maintenance: ?PCP:  Dr. Clelia Croft.   ?Vaccines are up to date but I do not have dates but have asked pt if she can  ?Colonoscopy:  done with Dr Matthias Hughs ?MMG:  07/29/2020 Negative ?Last pap smear:  prior to hysterectomy.   ?H/o abnormal pap smear:  no hx ? ? reports that she has never smoked. She has never used smokeless tobacco. She reports current alcohol use of about 7.0 standard drinks per week. She reports that she does not use drugs. ? ?Past Medical History:  ?Diagnosis Date  ? Allergy   ? Anxiety   ? Arthritis   ? Fibromyalgia   ? GERD (gastroesophageal reflux disease)   ? Glenoid labral tear   ? Headache(784.0)   ? TAKES ATENOLOL  ? Hypertension   ? MVP (mitral valve prolapse)   ? PONV (postoperative nausea and vomiting)   ? WITH [facility] DRIP/ no problems 9'13 with hip scope  ? ? ?Past Surgical History:  ?Procedure Laterality Date  ? BREAST SURGERY    ? DUCTS REMOVED FROM RT BREAST 2008  ? HIP ARTHROSCOPY    ? 2010 L HIP ARTHROSCOPY   ? HIP ARTHROSCOPY  04/20/2011  ? Procedure: ARTHROSCOPY HIP;  Surgeon: Loanne Drilling;  Location: [facility];  Service: Orthopedics;  Laterality: Left;  debridement labral tear, chondroplasty  ? JOINT REPLACEMENT    ? TOTAL HIP ARTHROPLASTY  11/09/2011  ? Procedure: TOTAL HIP ARTHROPLASTY;  Surgeon: Loanne Drilling, MD;  Location: [facility];  Service: Orthopedics;  Laterality: Left;  ? TOTAL SHOULDER ARTHROPLASTY Right 12/25/2018  ? TOTAL SHOULDER ARTHROPLASTY Right 12/25/2018  ? Procedure: RIGHT TOTAL SHOULDER ARTHROPLASTY;  Surgeon: Cammy Copa, MD;  Location: [facility] OR;  Service: Orthopedics;  Laterality: Right;  ? TUBAL  LIGATION    ? 1986  ? VAGINAL HYSTERECTOMY    ? ? ?Current Outpatient Medications  ?Medication Sig Dispense Refill  ? amitriptyline (ELAVIL) 25 MG tablet Take 25 mg by mouth at bedtime.     ? amoxicillin (AMOXIL) 500 MG tablet Take 4 tablets (2 g) 30 mins to an hour prior to dental procedure. 12 tablet 0  ? atenolol (TENORMIN) 25 MG tablet Take 25 mg by mouth every morning.      ? Cholecalciferol (VITAMIN D3) 1.25 MG (50000 UT) CAPS Take by mouth.    ? docusate sodium (COLACE) 100 MG capsule Take 100 mg by mouth 2 (two) times daily as needed. CONSTIPATION ?     ? gabapentin (NEURONTIN) 300 MG capsule Take 300-600 mg by mouth See admin instructions. 300 mg in the morning, and 600 mg at bedtime    ? HYDROcodone-acetaminophen (NORCO/VICODIN) 5-325 MG tablet Take 1-2 tablets by mouth every 6 (six) hours as needed for moderate pain or severe pain. 20 tablet 0  ? naproxen sodium (ALEVE) 220 MG tablet Take 440 mg by mouth daily as needed (pain).    ? omeprazole (PRILOSEC) 40 MG capsule Take 40 mg by mouth daily before breakfast.    ? PARoxetine (PAXIL) 30 MG tablet Take 30 mg by mouth at  bedtime.     ? polyethylene glycol powder (GLYCOLAX/MIRALAX) 17 GM/SCOOP powder Take 17 g by mouth daily as needed for mild constipation.    ? pramipexole (MIRAPEX) 1 MG tablet Take 2 mg by mouth at bedtime.   1  ? rizatriptan (MAXALT) 10 MG tablet Take 10 mg by mouth daily as needed for migraine.    ? telmisartan-hydrochlorothiazide (MICARDIS HCT) 80-25 MG tablet Take 1 tablet by mouth daily.  4  ? trolamine salicylate (ASPERCREME) 10 % cream Apply 1 application topically as needed for muscle pain.    ? albuterol (PROVENTIL HFA;VENTOLIN HFA) 108 (90 BASE) MCG/ACT inhaler Inhale into the lungs every 6 (six) hours as needed for wheezing or shortness of breath. (Patient not taking: Reported on 08/30/2021)    ? Azelastine HCl 0.15 % SOLN Place 1 spray into the nose 2 (two) times daily as needed. For allergies (Patient not taking: Reported on  08/30/2021)    ? ?No current facility-administered medications for this visit.  ? ? ?Family History  ?Problem Relation Age of Onset  ? Heart disease Other   ?     No family history of  ? ? ?Review of Systems  ?All other systems reviewed and are negative. ? ?Exam:   ?BP 125/89 (BP Location: Left Arm, Patient Position: Sitting, Cuff Size: Large)   Pulse 88   Ht 5\' 8"  (1.727 m) Comment: reported  Wt 166 lb 12.8 oz (75.7 kg)   BMI 25.36 kg/m?   Height: 5\' 8"  (172.7 cm) (reported) ? ?General appearance: alert, cooperative and appears stated age ?Breasts: normal appearance, no masses or tenderness ?Abdomen: soft, non-tender; bowel sounds normal; no masses,  no organomegaly ?Lymph nodes: Cervical, supraclavicular, and axillary nodes normal.  No abnormal inguinal nodes palpated ?Neurologic: Grossly normal ? ?Pelvic: External genitalia:  no lesions ?             Urethra:  normal appearing urethra with no masses, tenderness or lesions ?             Bartholins and Skenes: normal    ?             Vagina: normal appearing vagina with atrophic changes and no discharge, no lesions ?             Cervix: absent ?             Pap taken: Yes.   ?Bimanual Exam:  Uterus:  uterus absent ?             Adnexa: no mass, fullness, tenderness ?              Rectovaginal: Confirms ?              Anus:  normal sphincter tone, no lesions ? ?Chaperone, , CMA, was present for exam. ? ?Assessment/Plan: ?1. Vaginal atrophy ?- topical treatment options reviewed.  Pt would like to start HRT back but I feel this is not a good idea.  Will start with vaginal estrogen cream.  Risks reviewed. ?- estradiol (ESTRACE) 0.1 MG/GM vaginal cream; 1 gram vaginally twice weekly  Dispense: 42.5 g; Refill: 3 ? ?2. Encounter for screening mammogram for malignant neoplasm of breast ?- MM 3D SCREEN BREAST BILATERAL; Future ? ?3. Cervical cancer screening ?- Cytology - PAP( [place]) ? ?4. Postmenopausal bleeding ?- feel this is likely due to atrophy.   Will see if has improvement with treatment using vaginal estrogen cream ? ? ?

## 2021-08-31 ENCOUNTER — Encounter (HOSPITAL_BASED_OUTPATIENT_CLINIC_OR_DEPARTMENT_OTHER): Payer: Self-pay | Admitting: Obstetrics & Gynecology

## 2021-09-02 LAB — CYTOLOGY - PAP
Comment: NEGATIVE
Diagnosis: NEGATIVE
High risk HPV: NEGATIVE

## 2021-09-03 ENCOUNTER — Ambulatory Visit (HOSPITAL_BASED_OUTPATIENT_CLINIC_OR_DEPARTMENT_OTHER)
Admission: RE | Admit: 2021-09-03 | Discharge: 2021-09-03 | Disposition: A | Discharge status: Home / Self Care | Payer: Medicare Other | Source: Ambulatory Visit | Attending: Obstetrics & Gynecology | Admitting: Obstetrics & Gynecology

## 2021-09-03 ENCOUNTER — Encounter (HOSPITAL_BASED_OUTPATIENT_CLINIC_OR_DEPARTMENT_OTHER): Payer: Self-pay | Admitting: Radiology

## 2021-09-03 DIAGNOSIS — Z1231 Encounter for screening mammogram for malignant neoplasm of breast: Secondary | ICD-10-CM | POA: Diagnosis present

## 2022-03-07 ENCOUNTER — Ambulatory Visit: Payer: Self-pay

## 2022-03-07 NOTE — Patient Outreach (Signed)
  Care Coordination   Initial Visit Note   03/07/2022 Name: Brenda Short MRN: [id number] DOB: 09-19-1955  Brenda Short is a 66 y.o. year old female who sees Brenda Short, Brenda Short., MD for primary care. I spoke with  Brenda Short by phone today.  What matters to the patients health and wellness today?  No concerns, doing well at this time    Goals Addressed             This Visit's Progress    COMPLETED: Care Coordination Activities       Care Coordination Interventions: SDoH screening completed - no acute resource challenges identified at this time Determined the patient does not have concerns with medication costs and/or adherence Discussed the patient does have an advance directive and does not wish to make changes Performed chart review to note patient last participated in an annual wellness visit on 04/09/21 Education provided on the role of the care coordination team - no follow up desired at this time Instructed the patient to contact her primary care provider as needed         SDOH assessments and interventions completed:  Yes  SDOH Interventions Today    Flowsheet Row Most Recent Value  SDOH Interventions   Food Insecurity Interventions Intervention Not Indicated  Housing Interventions Intervention Not Indicated  Transportation Interventions Intervention Not Indicated  Utilities Interventions Intervention Not Indicated  Financial Strain Interventions Intervention Not Indicated        Care Coordination Interventions Activated:  Yes  Care Coordination Interventions:  Yes, provided   Follow up plan: No further intervention required.   Encounter Outcome:  Pt. Visit Completed   Daneen Schick, BSW, CDP Social Worker, Certified Dementia Practitioner [facility]  Care Coordination [phone]

## 2022-03-07 NOTE — Patient Instructions (Signed)
Visit Information  Thank you for taking time to visit with me today. Please don't hesitate to contact me if I can be of assistance to you.   Following are the goals we discussed today:   Goals Addressed             This Visit's Progress    COMPLETED: Care Coordination Activities       Care Coordination Interventions: [facility] screening completed - no acute resource challenges identified at this time Determined the patient does not have concerns with medication costs and/or adherence Discussed the patient does have an advance directive and does not wish to make changes Performed chart review to note patient last participated in an annual wellness visit on 04/09/21 Education provided on the role of the care coordination team - no follow up desired at this time Instructed the patient to contact her primary care provider as needed         If you are experiencing a Mental Health or [place] or need someone to talk to, please call 1-800-273-TALK (toll free, 24 hour hotline)  Patient verbalizes understanding of instructions and care plan provided today and agrees to view in [place]. Active MyChart status and patient understanding of how to access instructions and care plan via MyChart confirmed with patient.     No further follow up required: Instructed patient to contact her primary care provider as needed.  Daneen Schick, BSW, CDP Social Worker, Certified Dementia Practitioner [facility]  Care Coordination [phone]

## 2022-05-03 NOTE — H&P (Signed)
TOTAL HIP ADMISSION H&P  Patient is admitted for right total hip arthroplasty.  Subjective:  Chief Complaint: Right hip pain  HPI: Brenda Short, 66 y.o. female, has a history of pain and functional disability in the right hip due to arthritis and patient has failed non-surgical conservative treatments for greater than 12 weeks to include NSAID's and/or analgesics and activity modification. Onset of symptoms was abrupt, starting 1 years ago with rapidlly worsening course since that time. The patient noted no past surgery on the right hip. Patient currently rates pain in the right hip at 8 out of 10 with activity. Patient has night pain, worsening of pain with activity and weight bearing, pain that interfers with activities of daily living, and pain with passive range of motion. Patient has evidence of  rapidly progressive osteoarthritis. She has a large effusion and significant edema in the femoral head, as well as bone-on-bone throughout the superolateral aspect of the hip  by imaging studies. This condition presents safety issues increasing the risk of falls. There is no current active infection.  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   Epistaxis, recurrent 09/07/2013   Postop Hyponatremia 11/11/2011   Postop Hypokalemia 11/11/2011   OA (osteoarthritis) of hip 11/09/2011   Preop cardiovascular exam 10/07/2011   Chest pain 10/07/2011   Mitral valve prolapse 10/07/2011   Hypertension 10/07/2011    Past Medical History:  Diagnosis Date   Allergy    Anxiety    Arthritis    Fibromyalgia    GERD (gastroesophageal reflux disease)    Glenoid labral tear    Headache(784.0)    TAKES ATENOLOL   Hypertension    MVP (mitral valve prolapse)    PONV (postoperative nausea and vomiting)    WITH DURAMORPH DRIP/ no problems 9'13 with hip scope    Past Surgical  History:  Procedure Laterality Date   BREAST SURGERY     DUCTS REMOVED FROM RT BREAST 2008   HIP ARTHROSCOPY     2010 L HIP ARTHROSCOPY    HIP ARTHROSCOPY  04/20/2011   Procedure: ARTHROSCOPY HIP;  Surgeon: Gus RankinFrank V Aluisio;  Location: [facility];  Service: Orthopedics;  Laterality: Left;  debridement labral tear, chondroplasty   JOINT REPLACEMENT     TOTAL HIP ARTHROPLASTY  11/09/2011   Procedure: TOTAL HIP ARTHROPLASTY;  Surgeon: Loanne DrillingFrank V Aluisio, MD;  Location: [facility];  Service: Orthopedics;  Laterality: Left;   TOTAL SHOULDER ARTHROPLASTY Right 12/25/2018   TOTAL SHOULDER ARTHROPLASTY Right 12/25/2018   Procedure: RIGHT TOTAL SHOULDER ARTHROPLASTY;  Surgeon: Cammy Copaean, Gregory Scott, MD;  [facility];  Service: Orthopedics;  Laterality: Right;   TUBAL LIGATION     1986   VAGINAL HYSTERECTOMY      Prior to Admission medications   Medication Sig Start Date End Date Taking? Authorizing Provider  albuterol (PROVENTIL HFA;VENTOLIN HFA) 108 (90 BASE) MCG/ACT inhaler Inhale into the lungs every 6 (six) hours as needed for wheezing or shortness of breath. Patient not taking: Reported on 08/30/2021    [provider]  amitriptyline (ELAVIL) 25 MG tablet Take 25 mg by mouth at bedtime.     [provider]  amoxicillin (AMOXIL) 500 MG tablet Take 4 tablets (2 g) 30 mins to an hour prior to dental procedure. 03/22/19   Magnant, Charles L, PA-C  atenolol (TENORMIN) 25 MG tablet Take 25 mg by mouth every  morning.      [provider]  Azelastine HCl 0.15 % SOLN Place 1 spray into the nose 2 (two) times daily as needed. For allergies Patient not taking: Reported on 08/30/2021    [provider]  Cholecalciferol (VITAMIN D3) 1.25 MG (50000 UT) CAPS Take by mouth.    [provider]  docusate sodium (COLACE) 100 MG capsule Take 100 mg by mouth 2 (two) times daily as needed. CONSTIPATION      [provider]  estradiol (ESTRACE) 0.1 MG/GM vaginal cream 1  gram vaginally twice weekly 08/30/21   Jerene Bears, MD  gabapentin (NEURONTIN) 300 MG capsule Take 300-600 mg by mouth See admin instructions. 300 mg in the morning, and 600 mg at bedtime 04/01/18   [provider]  omeprazole (PRILOSEC) 40 MG capsule Take 40 mg by mouth daily before breakfast.    [provider]  PARoxetine (PAXIL) 30 MG tablet Take 30 mg by mouth at bedtime.     [provider]  polyethylene glycol powder (GLYCOLAX/MIRALAX) 17 GM/SCOOP powder Take 17 g by mouth daily as needed for mild constipation.    [provider]  pramipexole (MIRAPEX) 1 MG tablet Take 2 mg by mouth at bedtime.  03/16/18   [provider]  rizatriptan (MAXALT) 10 MG tablet Take 10 mg by mouth daily as needed for migraine. 11/18/15   [provider]  telmisartan-hydrochlorothiazide (MICARDIS HCT) 80-25 MG tablet Take 1 tablet by mouth daily. 03/16/18   [provider]  trolamine salicylate (ASPERCREME) 10 % cream Apply 1 application topically as needed for muscle pain.    [provider]    Allergies  Allergen Reactions   Morphine And Related Nausea And Vomiting    Social History   Socioeconomic History   Marital status: Married    Spouse name: Not on file   Number of children: 3   Years of education: Not on file   Highest education level: Not on file  Occupational History    Employer: [employer]    Comment: Nurse  Tobacco Use   Smoking status: Never   Smokeless tobacco: Never  Vaping Use   Vaping Use: Never used  Substance and Sexual Activity   Alcohol use: Yes    Alcohol/week: 7.0 standard drinks of alcohol    Types: 7 Glasses of wine per week    Comment: 1-2 glasses of wine per day   Drug use: No   Sexual activity: Yes  Other Topics Concern   Not on file  Social History Narrative   Not on file   Social Determinants of Health   Financial Resource Strain: Low Risk  (03/07/2022)   Overall Financial  Resource Strain (CARDIA)    Difficulty of Paying Living Expenses: Not very hard  Food Insecurity: No Food Insecurity (03/07/2022)   Hunger Vital Sign    Worried About Running Out of Food in the Last Year: Never true    Ran Out of Food in the Last Year: Never true  Transportation Needs: No Transportation Needs (03/07/2022)   PRAPARE - Administrator, Civil Service (Medical): No    Lack of Transportation (Non-Medical): No  Physical Activity: Not on file  Stress: Not on file  Social Connections: Not on file  Intimate Partner Violence: Not on file    Tobacco Use: Low Risk  (03/07/2022)   Patient History    Smoking Tobacco Use: Never    Smokeless Tobacco Use: Never  Passive Exposure: Not on file   Social History   Substance and Sexual Activity  Alcohol Use Yes   Alcohol/week: 7.0 standard drinks of alcohol   Types: 7 Glasses of wine per week   Comment: 1-2 glasses of wine per day    Family History  Problem Relation Age of Onset   Heart disease Other        No family history of    Review of Systems  Constitutional:  Negative for chills and fever.  HENT:  Negative for congestion, sore throat and tinnitus.   Eyes:  Negative for double vision, photophobia and pain.  Respiratory:  Negative for cough, shortness of breath and wheezing.   Cardiovascular:  Negative for chest pain, palpitations and orthopnea.  Gastrointestinal:  Negative for heartburn, nausea and vomiting.  Genitourinary:  Negative for dysuria, frequency and urgency.  Musculoskeletal:  Positive for joint pain.  Neurological:  Negative for dizziness, weakness and headaches.     Objective:  Physical Exam: Well nourished and well developed.  General: Alert and oriented x3, cooperative and pleasant, no acute distress.  Head: normocephalic, atraumatic, neck supple.  Eyes: EOMI.  Musculoskeletal:  Right Hip Exam: The range of motion: Flexion to 110 degrees, internal rotation to 20 degrees, external  rotation to 30 degrees, and abduction to 30 degrees with pain on range of motion. There is slight tenderness over the greater trochanteric bursa.  Calves soft and nontender. Motor function intact in LE. Strength 5/5 LE bilaterally. Neuro: Distal pulses 2+. Sensation to light touch intact in LE.  Imaging Review Plain radiographs demonstrate severe degenerative joint disease of the right hip. The bone quality appears to be adequate for age and reported activity level.  Assessment/Plan:  End stage arthritis, right hip  The patient history, physical examination, clinical judgement of the provider and imaging studies are consistent with end stage degenerative joint disease of the right hip and total hip arthroplasty is deemed medically necessary. The treatment options including medical management, injection therapy, arthroscopy and arthroplasty were discussed at length. The risks and benefits of total hip arthroplasty were presented and reviewed. The risks due to aseptic loosening, infection, stiffness, dislocation/subluxation, thromboembolic complications and other imponderables were discussed. The patient acknowledged the explanation, agreed to proceed with the plan and consent was signed. Patient is being admitted for inpatient treatment for surgery, pain control, PT, OT, prophylactic antibiotics, VTE prophylaxis, progressive ambulation and ADLs and discharge planning.The patient is planning to be discharged  home .   Patient's anticipated LOS is less than 2 midnights, meeting these requirements: - Younger than 62 - Lives within 1 hour of care - Has a competent adult at home to recover with post-op recover - NO history of  - Chronic pain requiring opiods  - Diabetes  - Coronary Artery Disease  - Heart failure  - Heart attack  - Stroke  - DVT/VTE  - Cardiac arrhythmia  - Respiratory Failure/COPD  - Renal failure  - Anemia  - Advanced Liver disease  Therapy Plans: HEP Disposition: Home  with husband Planned DVT Prophylaxis: Aspirin 325 mg BID DME Needed: None PCP: Martha Clan, MD TXA: IV Allergies: NKDA Anesthesia Concerns: N/V BMI: 26.9 Last HgbA1c: Not diabetic.  Pharmacy: [facility])  Other: - Appt with Dr. Clelia Croft on 12/8 - Tolerates hydrocodone, was given small prescription from Dr. Penni Bombard for hip  - Patient was instructed on what medications to stop prior to surgery. - Follow-up visit in 2 weeks with Dr. Lequita Halt -  Begin physical therapy following surgery - Pre-operative lab work as pre-surgical testing - Prescriptions will be provided in hospital at time of discharge  Arther Abbott, PA-C Orthopedic Surgery [facility] [place]

## 2022-05-04 NOTE — Progress Notes (Addendum)
COVID Vaccine Completed:  Date of COVID positive in last 90 days:  PCP - Martha Clan, MD Cardiologist -   Chest x-ray -  EKG -  Stress Test -  ECHO -  Cardiac Cath -  Pacemaker/ICD device last checked: Spinal Cord Stimulator:  Bowel Prep -   Sleep Study -  CPAP -   Fasting Blood Sugar -  Checks Blood Sugar _____ times a day  Last dose of GLP1 agonist-  N/A GLP1 instructions:  N/A   Last dose of SGLT-2 inhibitors-  N/A SGLT-2 instructions: N/A   Blood Thinner Instructions: Aspirin Instructions: Last Dose:  Activity level:  Can go up a flight of stairs and perform activities of daily living without stopping and without symptoms of chest pain or shortness of breath.  Able to exercise without symptoms  Unable to go up a flight of stairs without symptoms of     Anesthesia review:   Patient denies shortness of breath, fever, cough and chest pain at PAT appointment  Patient verbalized understanding of instructions that were given to them at the PAT appointment. Patient was also instructed that they will need to review over the PAT instructions again at home before surgery.

## 2022-05-04 NOTE — Patient Instructions (Signed)
SURGICAL WAITING ROOM VISITATION Patients having surgery or a procedure may have no more than 2 support people in the waiting area - these visitors may rotate.   Children under the age of 72 must have an adult with them who is not the patient. If the patient needs to stay at the hospital during part of their recovery, the visitor guidelines for inpatient rooms apply. Pre-op nurse will coordinate an appropriate time for 1 support person to accompany patient in pre-op.  This support person may not rotate.    Please refer to the [facility] website for the visitor guidelines for Inpatients (after your surgery is over and you are in a regular room).    Your procedure is scheduled on: 05/16/22   Report to [facility] Main Entrance    Report to admitting at 12:20 AM   Call this number if you have problems the morning of surgery [phone]   Do not eat food :After Midnight.   After Midnight you may have the following liquids until 11:50 AM DAY OF SURGERY  Water Non-Citrus Juices (without pulp, NO RED) Carbonated Beverages Black Coffee (NO MILK/CREAM OR CREAMERS, sugar ok)  Clear Tea (NO MILK/CREAM OR CREAMERS, sugar ok) regular and decaf                             Plain Jell-O (NO RED)                                           Fruit ices (not with fruit pulp, NO RED)                                     Popsicles (NO RED)                                                               Sports drinks like Gatorade (NO RED)                 The day of surgery:  Drink ONE (1) Pre-Surgery Clear Ensure at 11:50 AM the morning of surgery. Drink in one sitting. Do not sip.  This drink was given to you during your hospital  pre-op appointment visit. Nothing else to drink after completing the  Pre-Surgery Clear Ensure          If you have questions, please contact your surgeon's office.   FOLLOW BOWEL PREP AND ANY ADDITIONAL PRE OP INSTRUCTIONS YOU RECEIVED FROM YOUR SURGEON'S  OFFICE!!!     Oral Hygiene is also important to reduce your risk of infection.                                    Remember - BRUSH YOUR TEETH THE MORNING OF SURGERY WITH YOUR REGULAR TOOTHPASTE   Take these medicines the morning of surgery with A SIP OF WATER: Tylenol, Inhalers, Atenolol, Gabapentin, Norco, Omeprazole  You may not have any metal on your body including hair pins, jewelry, and body piercing             Do not wear make-up, lotions, powders, perfumes, or deodorant  Do not wear nail polish including gel and S&S, artificial/acrylic nails, or any other type of covering on natural nails including finger and toenails. If you have artificial nails, gel coating, etc. that needs to be removed by a nail salon please have this removed prior to surgery or surgery may need to be canceled/ delayed if the surgeon/ anesthesia feels like they are unable to be safely monitored.   Do not shave  48 hours prior to surgery.    Do not bring valuables to the hospital. [facility] IS NOT             RESPONSIBLE   FOR VALUABLES.   Contacts, dentures or bridgework may not be worn into surgery.   Bring small overnight bag day of surgery.   DO NOT BRING YOUR HOME MEDICATIONS TO THE HOSPITAL. PHARMACY WILL DISPENSE MEDICATIONS LISTED ON YOUR MEDICATION LIST TO YOU DURING YOUR ADMISSION IN THE HOSPITAL!   Special Instructions: Bring a copy of your healthcare power of attorney and living will documents the day of surgery if you haven't scanned them before.              Please read over the following fact sheets you were given: IF YOU HAVE QUESTIONS ABOUT YOUR PRE-OP INSTRUCTIONS PLEASE CALL [phone] Contras    If you received a COVID test during your pre-op visit  it is requested that you wear a mask when out in public, stay away from anyone that may not be feeling well and notify your surgeon if you develop symptoms. If you test positive for Covid or have been in  contact with anyone that has tested positive in the last 10 days please notify you surgeon.    [facility] - Preparing for Surgery Before surgery, you can play an important role.  Because skin is not sterile, your skin needs to be as free of germs as possible.  You can reduce the number of germs on your skin by washing with CHG (chlorahexidine gluconate) soap before surgery.  CHG is an antiseptic cleaner which kills germs and bonds with the skin to continue killing germs even after washing. Please DO NOT use if you have an allergy to CHG or antibacterial soaps.  If your skin becomes reddened/irritated stop using the CHG and inform your nurse when you arrive at Short Stay. Do not shave (including legs and underarms) for at least 48 hours prior to the first CHG shower.  You may shave your face/neck.  Please follow these instructions carefully:  1.  Shower with CHG Soap the night before surgery and the  morning of surgery.  2.  If you choose to wash your hair, wash your hair first as usual with your normal  shampoo.  3.  After you shampoo, rinse your hair and body thoroughly to remove the shampoo.                             4.  Use CHG as you would any other liquid soap.  You can apply chg directly to the skin and wash.  Gently with a scrungie or clean washcloth.  5.  Apply the CHG Soap to your body ONLY FROM THE NECK DOWN.   Do  not use on face/ open                           Wound or open sores. Avoid contact with eyes, ears mouth and   genitals (private parts).                       Wash face,  Genitals (private parts) with your normal soap.             6.  Wash thoroughly, paying special attention to the area where your    surgery  will be performed.  7.  Thoroughly rinse your body with warm water from the neck down.  8.  DO NOT shower/wash with your normal soap after using and rinsing off the CHG Soap.                9.  Pat yourself dry with a clean towel.            10.  Wear clean  pajamas.            11.  Place clean sheets on your bed the night of your first shower and do not  sleep with pets. Day of Surgery : Do not apply any lotions/deodorants the morning of surgery.  Please wear clean clothes to the hospital/surgery center.  FAILURE TO FOLLOW THESE INSTRUCTIONS MAY RESULT IN THE CANCELLATION OF YOUR SURGERY  PATIENT SIGNATURE_________________________________  NURSE SIGNATURE__________________________________  ________________________________________________________________________  Rogelia MireIncentive Spirometer  An incentive spirometer is a tool that can help keep your lungs clear and active. This tool measures how well you are filling your lungs with each breath. Taking long deep breaths may help reverse or decrease the chance of developing breathing (pulmonary) problems (especially infection) following: A long period of time when you are unable to move or be active. BEFORE THE PROCEDURE  If the spirometer includes an indicator to show your best effort, your nurse or respiratory therapist will set it to a desired goal. If possible, sit up straight or lean slightly forward. Try not to slouch. Hold the incentive spirometer in an upright position. INSTRUCTIONS FOR USE  Sit on the edge of your bed if possible, or sit up as far as you can in bed or on a chair. Hold the incentive spirometer in an upright position. Breathe out normally. Place the mouthpiece in your mouth and seal your lips tightly around it. Breathe in slowly and as deeply as possible, raising the piston or the ball toward the top of the column. Hold your breath for 3-5 seconds or for as long as possible. Allow the piston or ball to fall to the bottom of the column. Remove the mouthpiece from your mouth and breathe out normally. Rest for a few seconds and repeat Steps 1 through 7 at least 10 times every 1-2 hours when you are awake. Take your time and take a few normal breaths between deep breaths. The  spirometer may include an indicator to show your best effort. Use the indicator as a goal to work toward during each repetition. After each set of 10 deep breaths, practice coughing to be sure your lungs are clear. If you have an incision (the cut made at the time of surgery), support your incision when coughing by placing a pillow or rolled up towels firmly against it. Once you are able to get out of bed, walk around indoors and cough well. You may stop  using the incentive spirometer when instructed by your caregiver.  RISKS AND COMPLICATIONS Take your time so you do not get dizzy or light-headed. If you are in pain, you may need to take or ask for pain medication before doing incentive spirometry. It is harder to take a deep breath if you are having pain. AFTER USE Rest and breathe slowly and easily. It can be helpful to keep track of a log of your progress. Your caregiver can provide you with a simple table to help with this. If you are using the spirometer at home, follow these instructions: SEEK MEDICAL CARE IF:  You are having difficultly using the spirometer. You have trouble using the spirometer as often as instructed. Your pain medication is not giving enough relief while using the spirometer. You develop fever of 100.5 F (38.1 C) or higher. SEEK IMMEDIATE MEDICAL CARE IF:  You cough up bloody sputum that had not been present before. You develop fever of 102 F (38.9 C) or greater. You develop worsening pain at or near the incision site. MAKE SURE YOU:  Understand these instructions. Will watch your condition. Will get help right away if you are not doing well or get worse. Document Released: 10/03/2006 Document Revised: 08/15/2011 Document Reviewed: 12/04/2006 [employer] Patient Information 2014 [employer], [place].   ________________________________________________________________________ WHAT IS A BLOOD TRANSFUSION? Blood Transfusion Information  A transfusion is the  replacement of blood or some of its parts. Blood is made up of multiple cells which provide different functions. Red blood cells carry oxygen and are used for blood loss replacement. White blood cells fight against infection. Platelets control bleeding. Plasma helps clot blood. Other blood products are available for specialized needs, such as hemophilia or other clotting disorders. BEFORE THE TRANSFUSION  Who gives blood for transfusions?  Healthy volunteers who are fully evaluated to make sure their blood is safe. This is blood bank blood. Transfusion therapy is the safest it has ever been in the practice of medicine. Before blood is taken from a donor, a complete history is taken to make sure that person has no history of diseases nor engages in risky social behavior (examples are intravenous drug use or sexual activity with multiple partners). The donor's travel history is screened to minimize risk of transmitting infections, such as malaria. The donated blood is tested for signs of infectious diseases, such as HIV and hepatitis. The blood is then tested to be sure it is compatible with you in order to minimize the chance of a transfusion reaction. If you or a relative donates blood, this is often done in anticipation of surgery and is not appropriate for emergency situations. It takes many days to process the donated blood. RISKS AND COMPLICATIONS Although transfusion therapy is very safe and saves many lives, the main dangers of transfusion include:  Getting an infectious disease. Developing a transfusion reaction. This is an allergic reaction to something in the blood you were given. Every precaution is taken to prevent this. The decision to have a blood transfusion has been considered carefully by your caregiver before blood is given. Blood is not given unless the benefits outweigh the risks. AFTER THE TRANSFUSION Right after receiving a blood transfusion, you will usually feel much better and  more energetic. This is especially true if your red blood cells have gotten low (anemic). The transfusion raises the level of the red blood cells which carry oxygen, and this usually causes an energy increase. The nurse administering the transfusion will monitor you carefully  for complications. HOME CARE INSTRUCTIONS  No special instructions are needed after a transfusion. You may find your energy is better. Speak with your caregiver about any limitations on activity for underlying diseases you may have. SEEK MEDICAL CARE IF:  Your condition is not improving after your transfusion. You develop redness or irritation at the intravenous (IV) site. SEEK IMMEDIATE MEDICAL CARE IF:  Any of the following symptoms occur over the next 12 hours: Shaking chills. You have a temperature by mouth above 102 F (38.9 C), not controlled by medicine. Chest, back, or muscle pain. People around you feel you are not acting correctly or are confused. Shortness of breath or difficulty breathing. Dizziness and fainting. You get a rash or develop hives. You have a decrease in urine output. Your urine turns a dark color or changes to pink, red, or brown. Any of the following symptoms occur over the next 10 days: You have a temperature by mouth above 102 F (38.9 C), not controlled by medicine. Shortness of breath. Weakness after normal activity. The white part of the eye turns yellow (jaundice). You have a decrease in the amount of urine or are urinating less often. Your urine turns a dark color or changes to pink, red, or brown. Document Released: 05/20/2000 Document Revised: 08/15/2011 Document Reviewed: 01/07/2008 [facility] Patient Information 2014 [place], [place].  _______________________________________________________________________

## 2022-05-05 ENCOUNTER — Encounter (HOSPITAL_COMMUNITY)
Admission: RE | Admit: 2022-05-05 | Discharge: 2022-05-05 | Disposition: A | Discharge status: Home / Self Care | Payer: Medicare Other | Source: Ambulatory Visit | Attending: Orthopedic Surgery | Admitting: Orthopedic Surgery

## 2022-05-05 ENCOUNTER — Encounter (HOSPITAL_COMMUNITY): Payer: Self-pay

## 2022-05-05 VITALS — BP 126/93 | HR 66 | Temp 98.1°F | Resp 14 | Ht 67.5 in | Wt 166.0 lb

## 2022-05-05 DIAGNOSIS — I10 Essential (primary) hypertension: Secondary | ICD-10-CM | POA: Insufficient documentation

## 2022-05-05 DIAGNOSIS — R9431 Abnormal electrocardiogram [ECG] [EKG]: Secondary | ICD-10-CM | POA: Diagnosis not present

## 2022-05-05 DIAGNOSIS — Z01818 Encounter for other preprocedural examination: Secondary | ICD-10-CM | POA: Insufficient documentation

## 2022-05-05 LAB — BASIC METABOLIC PANEL
Anion gap: 7 (ref 5–15)
BUN: 20 mg/dL (ref 8–23)
CO2: 31 mmol/L (ref 22–32)
Calcium: 9.3 mg/dL (ref 8.9–10.3)
Chloride: 100 mmol/L (ref 98–111)
Creatinine, Ser: 0.8 mg/dL (ref 0.44–1.00)
GFR, Estimated: >60 mL/min (ref >60)
Glucose, Bld: 109 mg/dL — ABNORMAL HIGH (ref 70–99)
Potassium: 4.6 mmol/L (ref 3.5–5.1)
Sodium: 138 mmol/L (ref 135–145)

## 2022-05-05 LAB — SURGICAL PCR SCREEN
MRSA, PCR: NEGATIVE
Staphylococcus aureus: NEGATIVE

## 2022-05-05 LAB — CBC
HCT: 39.2 % (ref 36.0–46.0)
Hemoglobin: 13 g/dL (ref 12.0–15.0)
MCH: 30.8 pg (ref 26.0–34.0)
MCHC: 33.2 g/dL (ref 30.0–36.0)
MCV: 92.9 fL (ref 80.0–100.0)
Platelets: 315 10*3/uL (ref 150–400)
RBC: 4.22 MIL/uL (ref 3.87–5.11)
RDW: 12.7 % (ref 11.5–15.5)
WBC: 6.4 10*3/uL (ref 4.0–10.5)
nRBC: 0 % (ref 0.0–0.2)

## 2022-05-05 LAB — TYPE AND SCREEN
ABO/RH(D): A POS
Antibody Screen: NEGATIVE

## 2022-05-15 NOTE — Anesthesia Preprocedure Evaluation (Signed)
Anesthesia Evaluation  Patient identified by MRN, date of birth, ID band Patient awake    Reviewed: Allergy & Precautions, NPO status , Patient's Chart, lab work & pertinent test results, reviewed documented beta blocker date and time   History of Anesthesia Complications (+) PONV and history of anesthetic complications  Airway Mallampati: II  TM Distance: >3 FB Neck ROM: Full    Dental no notable dental hx.    Pulmonary neg pulmonary ROS   Pulmonary exam normal        Cardiovascular hypertension, Pt. on medications and Pt. on home beta blockers Normal cardiovascular exam     Neuro/Psych  Headaches  Anxiety        GI/Hepatic Neg liver ROS,GERD  Medicated,,  Endo/Other  negative endocrine ROS    Renal/GU negative Renal ROS  negative genitourinary   Musculoskeletal  (+) Arthritis ,  Fibromyalgia -  Abdominal   Peds  Hematology negative hematology ROS (+)   Anesthesia Other Findings Day of surgery medications reviewed with patient.  Reproductive/Obstetrics negative OB ROS                              Anesthesia Physical Anesthesia Plan  ASA: 2  Anesthesia Plan: Spinal   Post-op Pain Management: Ofirmev IV (intra-op)*   Induction:   PONV Risk Score and Plan: 3 and Treatment may vary due to age or medical condition, Midazolam, Propofol infusion, Dexamethasone and Ondansetron  Airway Management Planned: Natural Airway and Simple Face Mask  Additional Equipment: None  Intra-op Plan:   Post-operative Plan:   Informed Consent: I have reviewed the patients History and Physical, chart, labs and discussed the procedure including the risks, benefits and alternatives for the proposed anesthesia with the patient or authorized representative who has indicated his/her understanding and acceptance.       Plan Discussed with: CRNA  Anesthesia Plan Comments:         Anesthesia  Quick Evaluation

## 2022-05-16 ENCOUNTER — Ambulatory Visit (HOSPITAL_BASED_OUTPATIENT_CLINIC_OR_DEPARTMENT_OTHER): Payer: Medicare Other | Admitting: Anesthesiology

## 2022-05-16 ENCOUNTER — Other Ambulatory Visit: Payer: Self-pay

## 2022-05-16 ENCOUNTER — Encounter (HOSPITAL_COMMUNITY): Payer: Self-pay | Admitting: Orthopedic Surgery

## 2022-05-16 ENCOUNTER — Observation Stay (HOSPITAL_COMMUNITY): Payer: Medicare Other

## 2022-05-16 ENCOUNTER — Observation Stay (HOSPITAL_COMMUNITY)
Admission: RE | Admit: 2022-05-16 | Discharge: 2022-05-17 | Disposition: A | Discharge status: Home / Self Care | Payer: Medicare Other | Source: Ambulatory Visit | Attending: Orthopedic Surgery | Admitting: Orthopedic Surgery

## 2022-05-16 ENCOUNTER — Encounter (HOSPITAL_COMMUNITY)
Admission: RE | Disposition: A | Discharge status: Home / Self Care | Payer: Self-pay | Source: Ambulatory Visit | Attending: Orthopedic Surgery

## 2022-05-16 ENCOUNTER — Ambulatory Visit (HOSPITAL_COMMUNITY): Payer: Medicare Other | Admitting: Physician Assistant

## 2022-05-16 ENCOUNTER — Ambulatory Visit (HOSPITAL_COMMUNITY): Payer: Medicare Other

## 2022-05-16 DIAGNOSIS — I10 Essential (primary) hypertension: Secondary | ICD-10-CM | POA: Diagnosis not present

## 2022-05-16 DIAGNOSIS — Z96611 Presence of right artificial shoulder joint: Secondary | ICD-10-CM | POA: Diagnosis not present

## 2022-05-16 DIAGNOSIS — M1611 Unilateral primary osteoarthritis, right hip: Secondary | ICD-10-CM | POA: Diagnosis present

## 2022-05-16 DIAGNOSIS — Z96642 Presence of left artificial hip joint: Secondary | ICD-10-CM | POA: Insufficient documentation

## 2022-05-16 DIAGNOSIS — Z79899 Other long term (current) drug therapy: Secondary | ICD-10-CM | POA: Insufficient documentation

## 2022-05-16 HISTORY — PX: TOTAL HIP ARTHROPLASTY: SHX124

## 2022-05-16 SURGERY — ARTHROPLASTY, HIP, TOTAL, ANTERIOR APPROACH
Anesthesia: Spinal | Site: Hip | Laterality: Right

## 2022-05-16 MED ORDER — AMISULPRIDE (ANTIEMETIC) 5 MG/2ML IV SOLN: 10.0000 mg | Freq: Once | INTRAVENOUS | Status: DC | PRN

## 2022-05-16 MED ORDER — METOCLOPRAMIDE HCL 5 MG PO TABS: 5.0000 mg | ORAL_TABLET | Freq: Three times a day (TID) | ORAL | Status: DC | PRN

## 2022-05-16 MED ORDER — ATENOLOL 25 MG PO TABS
25.0000 mg | ORAL_TABLET | Freq: Every day | ORAL | Status: DC
  Administered 2022-05-17: 25 mg via ORAL
  Filled 2022-05-16: qty 1

## 2022-05-16 MED ORDER — DEXAMETHASONE SODIUM PHOSPHATE 10 MG/ML IJ SOLN
10.0000 mg | Freq: Once | INTRAMUSCULAR | Status: AC
  Administered 2022-05-17: 10 mg via INTRAVENOUS
  Filled 2022-05-16: qty 1

## 2022-05-16 MED ORDER — TRANEXAMIC ACID-NACL 1000-0.7 MG/100ML-% IV SOLN
1000.0000 mg | INTRAVENOUS | Status: AC
  Administered 2022-05-16: 1000 mg via INTRAVENOUS
  Filled 2022-05-16: qty 100

## 2022-05-16 MED ORDER — PRAMIPEXOLE DIHYDROCHLORIDE 0.25 MG PO TABS
2.0000 mg | ORAL_TABLET | Freq: Every day | ORAL | Status: DC
  Administered 2022-05-16: 2 mg via ORAL
  Filled 2022-05-16: qty 8

## 2022-05-16 MED ORDER — METHOCARBAMOL 500 MG IVPB - SIMPLE MED
INTRAVENOUS | Status: AC
  Filled 2022-05-16: qty 55

## 2022-05-16 MED ORDER — AMITRIPTYLINE HCL 25 MG PO TABS
25.0000 mg | ORAL_TABLET | Freq: Every day | ORAL | Status: DC
  Administered 2022-05-16: 25 mg via ORAL
  Filled 2022-05-16: qty 1
  Filled 2022-05-16: qty 0.5

## 2022-05-16 MED ORDER — SODIUM CHLORIDE 0.9 % IV SOLN: INTRAVENOUS | Status: DC

## 2022-05-16 MED ORDER — POVIDONE-IODINE 10 % EX SWAB: 2.0000 | Freq: Once | CUTANEOUS | Status: AC

## 2022-05-16 MED ORDER — PHENYLEPHRINE HCL-NACL 20-0.9 MG/250ML-% IV SOLN
INTRAVENOUS | Status: DC | PRN
  Administered 2022-05-16: 30 ug/min via INTRAVENOUS

## 2022-05-16 MED ORDER — PROPOFOL 500 MG/50ML IV EMUL
INTRAVENOUS | Status: DC | PRN
  Administered 2022-05-16: 50 ug/kg/min via INTRAVENOUS

## 2022-05-16 MED ORDER — ORAL CARE MOUTH RINSE: 15.0000 mL | Freq: Once | OROMUCOSAL | Status: AC

## 2022-05-16 MED ORDER — FENTANYL CITRATE (PF) 100 MCG/2ML IJ SOLN
INTRAMUSCULAR | Status: DC | PRN
  Administered 2022-05-16: 50 ug via INTRAVENOUS

## 2022-05-16 MED ORDER — GABAPENTIN 300 MG PO CAPS
600.0000 mg | ORAL_CAPSULE | Freq: Every day | ORAL | Status: DC
  Administered 2022-05-16: 600 mg via ORAL
  Filled 2022-05-16: qty 2

## 2022-05-16 MED ORDER — HYDROMORPHONE HCL 1 MG/ML IJ SOLN
0.5000 mg | INTRAMUSCULAR | Status: DC | PRN
  Administered 2022-05-16 – 2022-05-17 (×2): 0.5 mg via INTRAVENOUS
  Filled 2022-05-16 (×2): qty 1

## 2022-05-16 MED ORDER — BUPIVACAINE IN DEXTROSE 0.75-8.25 % IT SOLN
INTRATHECAL | Status: DC | PRN
  Administered 2022-05-16: 1.6 mL via INTRATHECAL

## 2022-05-16 MED ORDER — PANTOPRAZOLE SODIUM 40 MG PO TBEC
80.0000 mg | DELAYED_RELEASE_TABLET | Freq: Every day | ORAL | Status: DC
  Administered 2022-05-17: 80 mg via ORAL
  Filled 2022-05-16: qty 2

## 2022-05-16 MED ORDER — FENTANYL CITRATE PF 50 MCG/ML IJ SOSY
PREFILLED_SYRINGE | INTRAMUSCULAR | Status: AC
  Filled 2022-05-16: qty 1

## 2022-05-16 MED ORDER — CEFAZOLIN SODIUM-DEXTROSE 2-4 GM/100ML-% IV SOLN
2.0000 g | Freq: Four times a day (QID) | INTRAVENOUS | Status: AC
  Administered 2022-05-16 – 2022-05-17 (×2): 2 g via INTRAVENOUS
  Filled 2022-05-16 (×2): qty 100

## 2022-05-16 MED ORDER — ACETAMINOPHEN 10 MG/ML IV SOLN
1000.0000 mg | Freq: Four times a day (QID) | INTRAVENOUS | Status: DC
  Administered 2022-05-16: 1000 mg via INTRAVENOUS
  Filled 2022-05-16: qty 100

## 2022-05-16 MED ORDER — CEFAZOLIN SODIUM-DEXTROSE 2-4 GM/100ML-% IV SOLN
2.0000 g | INTRAVENOUS | Status: AC
  Administered 2022-05-16: 2 g via INTRAVENOUS
  Filled 2022-05-16: qty 100

## 2022-05-16 MED ORDER — PHENOL 1.4 % MT LIQD: 1.0000 | OROMUCOSAL | Status: DC | PRN

## 2022-05-16 MED ORDER — OXYCODONE HCL 5 MG PO TABS
5.0000 mg | ORAL_TABLET | Freq: Once | ORAL | Status: AC | PRN
  Administered 2022-05-16: 5 mg via ORAL

## 2022-05-16 MED ORDER — DIPHENHYDRAMINE HCL 12.5 MG/5ML PO ELIX: 12.5000 mg | ORAL_SOLUTION | ORAL | Status: DC | PRN

## 2022-05-16 MED ORDER — ONDANSETRON HCL 4 MG PO TABS: 4.0000 mg | ORAL_TABLET | Freq: Four times a day (QID) | ORAL | Status: DC | PRN

## 2022-05-16 MED ORDER — BUPIVACAINE-EPINEPHRINE (PF) 0.5% -1:200000 IJ SOLN
INTRAMUSCULAR | Status: AC
  Filled 2022-05-16: qty 1.8

## 2022-05-16 MED ORDER — METOCLOPRAMIDE HCL 5 MG/ML IJ SOLN: 5.0000 mg | Freq: Three times a day (TID) | INTRAMUSCULAR | Status: DC | PRN

## 2022-05-16 MED ORDER — FENTANYL CITRATE (PF) 100 MCG/2ML IJ SOLN
INTRAMUSCULAR | Status: AC
  Filled 2022-05-16: qty 2

## 2022-05-16 MED ORDER — SUMATRIPTAN SUCCINATE 50 MG PO TABS: 50.0000 mg | ORAL_TABLET | Freq: Every day | ORAL | Status: DC | PRN

## 2022-05-16 MED ORDER — OXYCODONE HCL 5 MG/5ML PO SOLN: 5.0000 mg | Freq: Once | ORAL | Status: AC | PRN

## 2022-05-16 MED ORDER — IRBESARTAN 150 MG PO TABS
300.0000 mg | ORAL_TABLET | Freq: Every day | ORAL | Status: DC
  Filled 2022-05-16: qty 2

## 2022-05-16 MED ORDER — GABAPENTIN 300 MG PO CAPS
300.0000 mg | ORAL_CAPSULE | Freq: Every morning | ORAL | Status: DC
  Administered 2022-05-17: 300 mg via ORAL
  Filled 2022-05-16: qty 1

## 2022-05-16 MED ORDER — PAROXETINE HCL 10 MG PO TABS
30.0000 mg | ORAL_TABLET | Freq: Every day | ORAL | Status: DC
  Administered 2022-05-16: 30 mg via ORAL
  Filled 2022-05-16: qty 3

## 2022-05-16 MED ORDER — ALBUTEROL SULFATE (2.5 MG/3ML) 0.083% IN NEBU: 3.0000 mL | INHALATION_SOLUTION | Freq: Four times a day (QID) | RESPIRATORY_TRACT | Status: DC | PRN

## 2022-05-16 MED ORDER — FENTANYL CITRATE PF 50 MCG/ML IJ SOSY
25.0000 ug | PREFILLED_SYRINGE | INTRAMUSCULAR | Status: DC | PRN
  Administered 2022-05-16: 50 ug via INTRAVENOUS

## 2022-05-16 MED ORDER — OXYCODONE HCL 5 MG PO TABS
ORAL_TABLET | ORAL | Status: AC
  Filled 2022-05-16: qty 1

## 2022-05-16 MED ORDER — TELMISARTAN-HCTZ 80-25 MG PO TABS: 1.0000 | ORAL_TABLET | Freq: Every day | ORAL | Status: DC

## 2022-05-16 MED ORDER — ORAL CARE MOUTH RINSE: 15.0000 mL | OROMUCOSAL | Status: DC | PRN

## 2022-05-16 MED ORDER — HYDROCODONE-ACETAMINOPHEN 5-325 MG PO TABS
1.0000 | ORAL_TABLET | ORAL | Status: DC | PRN
  Administered 2022-05-16: 2 via ORAL
  Administered 2022-05-17: 1 via ORAL
  Administered 2022-05-17: 2 via ORAL
  Filled 2022-05-16: qty 1
  Filled 2022-05-16 (×2): qty 2

## 2022-05-16 MED ORDER — ACETAMINOPHEN 325 MG PO TABS: 325.0000 mg | ORAL_TABLET | Freq: Four times a day (QID) | ORAL | Status: DC | PRN

## 2022-05-16 MED ORDER — ASPIRIN 325 MG PO TBEC
325.0000 mg | DELAYED_RELEASE_TABLET | Freq: Two times a day (BID) | ORAL | Status: DC
  Administered 2022-05-17: 325 mg via ORAL
  Filled 2022-05-16: qty 1

## 2022-05-16 MED ORDER — METHOCARBAMOL 500 MG PO TABS
500.0000 mg | ORAL_TABLET | Freq: Four times a day (QID) | ORAL | Status: DC | PRN
  Filled 2022-05-16: qty 1

## 2022-05-16 MED ORDER — MENTHOL 3 MG MT LOZG: 1.0000 | LOZENGE | OROMUCOSAL | Status: DC | PRN

## 2022-05-16 MED ORDER — FLEET ENEMA 7-19 GM/118ML RE ENEM: 1.0000 | ENEMA | Freq: Once | RECTAL | Status: DC | PRN

## 2022-05-16 MED ORDER — METHOCARBAMOL 500 MG IVPB - SIMPLE MED
500.0000 mg | Freq: Four times a day (QID) | INTRAVENOUS | Status: DC | PRN
  Administered 2022-05-16: 500 mg via INTRAVENOUS

## 2022-05-16 MED ORDER — DEXAMETHASONE SODIUM PHOSPHATE 10 MG/ML IJ SOLN
8.0000 mg | Freq: Once | INTRAMUSCULAR | Status: AC
  Administered 2022-05-16: 8 mg via INTRAVENOUS

## 2022-05-16 MED ORDER — DOCUSATE SODIUM 100 MG PO CAPS
100.0000 mg | ORAL_CAPSULE | Freq: Two times a day (BID) | ORAL | Status: DC
  Administered 2022-05-16 – 2022-05-17 (×2): 100 mg via ORAL
  Filled 2022-05-16 (×2): qty 1

## 2022-05-16 MED ORDER — HYDROCHLOROTHIAZIDE 25 MG PO TABS: 25.0000 mg | ORAL_TABLET | Freq: Every day | ORAL | Status: DC

## 2022-05-16 MED ORDER — GABAPENTIN 300 MG PO CAPS: 300.0000 mg | ORAL_CAPSULE | ORAL | Status: DC

## 2022-05-16 MED ORDER — ONDANSETRON HCL 4 MG/2ML IJ SOLN: 4.0000 mg | Freq: Four times a day (QID) | INTRAMUSCULAR | Status: DC | PRN

## 2022-05-16 MED ORDER — PROPOFOL 10 MG/ML IV BOLUS
INTRAVENOUS | Status: DC | PRN
  Administered 2022-05-16: 10 mg via INTRAVENOUS
  Administered 2022-05-16: 30 mg via INTRAVENOUS

## 2022-05-16 MED ORDER — ONDANSETRON HCL 4 MG/2ML IJ SOLN
INTRAMUSCULAR | Status: DC | PRN
  Administered 2022-05-16: 4 mg via INTRAVENOUS

## 2022-05-16 MED ORDER — 0.9 % SODIUM CHLORIDE (POUR BTL) OPTIME
TOPICAL | Status: DC | PRN
  Administered 2022-05-16: 1000 mL

## 2022-05-16 MED ORDER — GLYCOPYRROLATE PF 0.2 MG/ML IJ SOSY
PREFILLED_SYRINGE | INTRAMUSCULAR | Status: DC | PRN
  Administered 2022-05-16: .2 mg via INTRAVENOUS

## 2022-05-16 MED ORDER — HYDROCODONE-ACETAMINOPHEN 7.5-325 MG PO TABS: 1.0000 | ORAL_TABLET | ORAL | Status: DC | PRN

## 2022-05-16 MED ORDER — POLYETHYLENE GLYCOL 3350 17 G PO PACK: 17.0000 g | PACK | Freq: Every day | ORAL | Status: DC | PRN

## 2022-05-16 MED ORDER — BUPIVACAINE HCL (PF) 0.25 % IJ SOLN
INTRAMUSCULAR | Status: AC
  Filled 2022-05-16: qty 30

## 2022-05-16 MED ORDER — CHLORHEXIDINE GLUCONATE 0.12 % MT SOLN: 15.0000 mL | Freq: Once | OROMUCOSAL | Status: AC

## 2022-05-16 MED ORDER — STERILE WATER FOR IRRIGATION IR SOLN
Status: DC | PRN
  Administered 2022-05-16: 2000 mL

## 2022-05-16 MED ORDER — LACTATED RINGERS IV SOLN: INTRAVENOUS | Status: DC

## 2022-05-16 MED ORDER — BISACODYL 10 MG RE SUPP: 10.0000 mg | Freq: Every day | RECTAL | Status: DC | PRN

## 2022-05-16 MED ORDER — BUPIVACAINE HCL 0.25 % IJ SOLN
INTRAMUSCULAR | Status: DC | PRN
  Administered 2022-05-16: 30 mL

## 2022-05-16 MED ORDER — PROPOFOL 1000 MG/100ML IV EMUL
INTRAVENOUS | Status: AC
  Filled 2022-05-16: qty 100

## 2022-05-16 SURGICAL SUPPLY — 48 items
BAG COUNTER SPONGE SURGICOUNT (BAG) IMPLANT
BAG DECANTER FOR FLEXI CONT (MISCELLANEOUS) IMPLANT
BAG SPEC THK2 15X12 ZIP CLS (MISCELLANEOUS)
BAG SPNG CNTER NS LX DISP (BAG) ×1
BAG ZIPLOCK 12X15 (MISCELLANEOUS) IMPLANT
BLADE SAG 18X100X1.27 (BLADE) ×1 IMPLANT
CLSR STERI-STRIP ANTIMIC 1/2X4 (GAUZE/BANDAGES/DRESSINGS) IMPLANT
COVER PERINEAL POST (MISCELLANEOUS) ×1 IMPLANT
COVER SURGICAL LIGHT HANDLE (MISCELLANEOUS) ×1 IMPLANT
CUP ACET PINNACLE SECTR 50MM (Hips) IMPLANT
DRAPE FOOT SWITCH (DRAPES) ×1 IMPLANT
DRAPE STERI IOBAN 125X83 (DRAPES) ×1 IMPLANT
DRAPE U-SHAPE 47X51 STRL (DRAPES) ×2 IMPLANT
DRSG AQUACEL AG ADV 3.5X 6 (GAUZE/BANDAGES/DRESSINGS) IMPLANT
DRSG AQUACEL AG ADV 3.5X10 (GAUZE/BANDAGES/DRESSINGS) ×1 IMPLANT
DURAPREP 26ML APPLICATOR (WOUND CARE) ×1 IMPLANT
ELECT REM PT RETURN 15FT ADLT (MISCELLANEOUS) ×1 IMPLANT
GLOVE BIO SURGEON STRL SZ 6.5 (GLOVE) IMPLANT
GLOVE BIO SURGEON STRL SZ7.5 (GLOVE) IMPLANT
GLOVE BIO SURGEON STRL SZ8 (GLOVE) ×1 IMPLANT
GLOVE BIOGEL PI IND STRL 6.5 (GLOVE) IMPLANT
GLOVE BIOGEL PI IND STRL 7.0 (GLOVE) IMPLANT
GLOVE BIOGEL PI IND STRL 8 (GLOVE) ×1 IMPLANT
GOWN STRL REUS W/ TWL LRG LVL3 (GOWN DISPOSABLE) ×1 IMPLANT
GOWN STRL REUS W/ TWL XL LVL3 (GOWN DISPOSABLE) IMPLANT
GOWN STRL REUS W/TWL LRG LVL3 (GOWN DISPOSABLE) ×1
GOWN STRL REUS W/TWL XL LVL3 (GOWN DISPOSABLE) ×1
HEAD FEMORAL 32 CERAMIC (Hips) IMPLANT
HOLDER FOLEY CATH W/STRAP (MISCELLANEOUS) ×1 IMPLANT
KIT TURNOVER KIT A (KITS) IMPLANT
LINER MARATHON 32 50 (Hips) IMPLANT
MANIFOLD NEPTUNE II (INSTRUMENTS) ×1 IMPLANT
PACK ANTERIOR HIP CUSTOM (KITS) ×1 IMPLANT
PENCIL SMOKE EVACUATOR COATED (MISCELLANEOUS) ×1 IMPLANT
PINNACLE SECTOR CUP 50MM (Hips) ×1 IMPLANT
SPIKE FLUID TRANSFER (MISCELLANEOUS) ×1 IMPLANT
STEM FEMORAL SZ 5MM STD ACTIS (Stem) IMPLANT
STRIP CLOSURE SKIN 1/2X4 (GAUZE/BANDAGES/DRESSINGS) ×1 IMPLANT
SUT ETHIBOND NAB CT1 #1 30IN (SUTURE) ×1 IMPLANT
SUT MNCRL AB 4-0 PS2 18 (SUTURE) ×1 IMPLANT
SUT STRATAFIX 0 PDS 27 VIOLET (SUTURE) ×1
SUT VIC AB 2-0 CT1 27 (SUTURE) ×2
SUT VIC AB 2-0 CT1 TAPERPNT 27 (SUTURE) ×2 IMPLANT
SUTURE STRATFX 0 PDS 27 VIOLET (SUTURE) ×1 IMPLANT
TRAY FOLEY MTR SLVR 14FR STAT (SET/KITS/TRAYS/PACK) IMPLANT
TRAY FOLEY MTR SLVR 16FR STAT (SET/KITS/TRAYS/PACK) ×1 IMPLANT
TUBE SUCTION HIGH CAP CLEAR NV (SUCTIONS) ×1 IMPLANT
WATER STERILE IRR 1000ML POUR (IV SOLUTION) IMPLANT

## 2022-05-16 NOTE — Transfer of Care (Signed)
Immediate Anesthesia Transfer of Care Note  Patient: Brenda Short  Procedure(s) Performed: TOTAL HIP ARTHROPLASTY ANTERIOR APPROACH (Right: Hip)  Patient Location: PACU  Anesthesia Type:Spinal and MAC combined with regional for post-op pain  Level of Consciousness: awake, alert , oriented, and patient cooperative  Airway & Oxygen Therapy: Patient Spontanous Breathing  Post-op Assessment: Report given to RN and Post -op Vital signs reviewed and stable  Post vital signs: Reviewed and stable  Last Vitals:  Vitals Value Taken Time  BP 87/63 05/16/22 1611  Temp    Pulse 80 05/16/22 1613  Resp 11 05/16/22 1613  SpO2 93 % 05/16/22 1613  Vitals shown include unvalidated device data.  Last Pain:  Vitals:   05/16/22 1300  TempSrc:   PainSc: 0-No pain      Patients Stated Pain Goal: 4 (04/21/51 0802)  Complications: No notable events documented.

## 2022-05-16 NOTE — Care Plan (Signed)
Ortho Bundle Case Management Note  Patient Details  Name: Brenda Short MRN: [id number] Date of Birth: 19-Oct-1955  R THA on 05-16-22 DCP:  Home with husband DME:  No needs, has a RW PT:  HEP                   DME Arranged:  N/A DME Agency:  NA  HH Arranged:  NA HH Agency:  NA  Additional Comments: Please contact me with any questions of if this plan should need to change.  Ennis Forts, RN,CCM [facility]  [phone] 05/16/2022, 1:21 PM

## 2022-05-16 NOTE — Discharge Instructions (Signed)
°Brenda Aluisio, MD °Total Joint Specialist °[facility] °[address] °[place], [place] [address] °[phone] ° °ANTERIOR APPROACH TOTAL HIP REPLACEMENT POSTOPERATIVE DIRECTIONS ° ° ° ° °Hip Rehabilitation, Guidelines Following Surgery  °The results of a hip operation are greatly improved after range of motion and muscle strengthening exercises. Follow all safety measures which are given to protect your hip. If any of these exercises cause increased pain or swelling in your joint, decrease the amount until you are comfortable again. Then slowly increase the exercises. Call your caregiver if you have problems or questions.  ° °BLOOD CLOT PREVENTION °Take a 325 mg Aspirin two times a day for three weeks following surgery. Then take an 81 mg Aspirin once a day for three weeks. Then discontinue Aspirin. °You may resume your vitamins/supplements upon discharge from the hospital. °Do not take any NSAIDs (Advil, Aleve, Ibuprofen, Meloxicam, etc.) until you have discontinued the 325 mg Aspirin. ° °HOME CARE INSTRUCTIONS  °Remove items at home which could result in a fall. This includes throw rugs or furniture in walking pathways.  °ICE to the affected hip as frequently as 20-30 minutes an hour and then as needed for pain and swelling. Continue to use ice on the hip for pain and swelling from surgery. You may notice swelling that will progress down to the foot and ankle. This is normal after surgery. Elevate the leg when you are not up walking on it.   °Continue to use the breathing machine which will help keep your temperature down.  It is common for your temperature to cycle up and down following surgery, especially at night when you are not up moving around and exerting yourself.  The breathing machine keeps your lungs expanded and your temperature down. ° °DIET °You may resume your previous home diet once your are discharged from the hospital. ° °DRESSING / WOUND CARE / SHOWERING °You have  an adhesive waterproof bandage over the incision. Leave this in place until your first follow-up appointment. Once you remove this you will not need to place another bandage.  °You may begin showering 3 days following surgery, but do not submerge the incision under water. ° °ACTIVITY °For the first 3-5 days, it is important to rest and keep the operative leg elevated. You should, as a general rule, rest for 50 minutes and walk/stretch for 10 minutes per hour. After 5 days, you may slowly increase activity as tolerated.  °Perform the exercises you were provided twice a day for about 15-20 minutes each session. Begin these 2 days following surgery. °Walk with your walker as instructed. Use the walker until you are comfortable transitioning to a cane. Walk with the cane in the opposite hand of the operative leg. You may discontinue the cane once you are comfortable and walking steadily. °Avoid periods of inactivity such as sitting longer than an hour when not asleep. This helps prevent blood clots.  °Do not drive a car for 6 weeks or until released by your surgeon.  °Do not drive while taking narcotics. ° °TED HOSE STOCKINGS °Wear the elastic stockings on both legs for three weeks following surgery during the day. You may remove them at night while sleeping. ° °WEIGHT BEARING °Weight bearing as tolerated with assist device (walker, cane, etc) as directed, use it as long as suggested by your surgeon or therapist, typically at least 4-6 weeks. ° °POSTOPERATIVE CONSTIPATION PROTOCOL °Constipation - defined medically as fewer than three stools per week and severe constipation as   less than one stool per week. ° °One of the most common issues patients have following surgery is constipation.  Even if you have a regular bowel pattern at home, your normal regimen is likely to be disrupted due to multiple reasons following surgery.  Combination of anesthesia, postoperative narcotics, change in appetite and fluid intake all can  affect your bowels.  In order to avoid complications following surgery, here are some recommendations in order to help you during your recovery period. ° °Colace (docusate) - Pick up an over-the-counter form of Colace or another stool softener and take twice a day as long as you are requiring postoperative pain medications.  Take with a full glass of water daily.  If you experience loose stools or diarrhea, hold the colace until you stool forms back up.  If your symptoms do not get better within 1 week or if they get worse, check with your doctor. °Dulcolax (bisacodyl) - Pick up over-the-counter and take as directed by the product packaging as needed to assist with the movement of your bowels.  Take with a full glass of water.  Use this product as needed if not relieved by Colace only.  °MiraLax (polyethylene glycol) - Pick up over-the-counter to have on hand.  MiraLax is a solution that will increase the amount of water in your bowels to assist with bowel movements.  Take as directed and can mix with a glass of water, juice, soda, coffee, or tea.  Take if you go more than two days without a movement.Do not use MiraLax more than once per day. Call your doctor if you are still constipated or irregular after using this medication for 7 days in a row. ° °If you continue to have problems with postoperative constipation, please contact the office for further assistance and recommendations.  If you experience "the worst abdominal pain ever" or develop nausea or vomiting, please contact the office immediatly for further recommendations for treatment. ° °ITCHING ° If you experience itching with your medications, try taking only a single pain pill, or even half a pain pill at a time.  You can also use Benadryl over the counter for itching or also to help with sleep.  ° °MEDICATIONS °See your medication summary on the “After Visit Summary” that the nursing staff will review with you prior to discharge.  You may have some home  medications which will be placed on hold until you complete the course of blood thinner medication.  It is important for you to complete the blood thinner medication as prescribed by your surgeon.  Continue your approved medications as instructed at time of discharge. ° °PRECAUTIONS °If you experience chest pain or shortness of breath - call 911 immediately for transfer to the hospital emergency department.  °If you develop a fever greater that 101 F, purulent drainage from wound, increased redness or drainage from wound, foul odor from the wound/dressing, or calf pain - CONTACT YOUR SURGEON.   °                                                °FOLLOW-UP APPOINTMENTS °Make sure you keep all of your appointments after your operation with your surgeon and caregivers. You should call the office at the above phone number and make an appointment for approximately two weeks after the date of your surgery or on the   date instructed by your surgeon outlined in the "After Visit Summary". ° °RANGE OF MOTION AND STRENGTHENING EXERCISES  °These exercises are designed to help you keep full movement of your hip joint. Follow your caregiver's or physical therapist's instructions. Perform all exercises about fifteen times, three times per day or as directed. Exercise both hips, even if you have had only one joint replacement. These exercises can be done on a training (exercise) mat, on the floor, on a table or on a bed. Use whatever works the best and is most comfortable for you. Use music or television while you are exercising so that the exercises are a pleasant break in your day. This will make your life better with the exercises acting as a break in routine you can look forward to.  °Lying on your back, slowly slide your foot toward your buttocks, raising your knee up off the floor. Then slowly slide your foot back down until your leg is straight again.  °Lying on your back spread your legs as far apart as you can without causing  discomfort.  °Lying on your side, raise your upper leg and foot straight up from the floor as far as is comfortable. Slowly lower the leg and repeat.  °Lying on your back, tighten up the muscle in the front of your thigh (quadriceps muscles). You can do this by keeping your leg straight and trying to raise your heel off the floor. This helps strengthen the largest muscle supporting your knee.  °Lying on your back, tighten up the muscles of your buttocks both with the legs straight and with the knee bent at a comfortable angle while keeping your heel on the floor.  ° °POST-OPERATIVE OPIOID TAPER INSTRUCTIONS: °It is important to wean off of your opioid medication as soon as possible. If you do not need pain medication after your surgery it is ok to stop day one. °Opioids include: °Codeine, Hydrocodone(Norco, Vicodin), Oxycodone(Percocet, oxycontin) and hydromorphone amongst others.  °Long term and even short term use of opiods can cause: °Increased pain response °Dependence °Constipation °Depression °Respiratory depression °And more.  °Withdrawal symptoms can include °Flu like symptoms °Nausea, vomiting °And more °Techniques to manage these symptoms °Hydrate well °Eat regular healthy meals °Stay active °Use relaxation techniques(deep breathing, meditating, yoga) °Do Not substitute Alcohol to help with tapering °If you have been on opioids for less than two weeks and do not have pain than it is ok to stop all together.  °Plan to wean off of opioids °This plan should start within one week post op of your joint replacement. °Maintain the same interval or time between taking each dose and first decrease the dose.  °Cut the total daily intake of opioids by one tablet each day °Next start to increase the time between doses. °The last dose that should be eliminated is the evening dose.  ° °IF YOU ARE TRANSFERRED TO A SKILLED REHAB FACILITY °If the patient is transferred to a skilled rehab facility following release from the  hospital, a list of the current medications will be sent to the facility for the patient to continue.  When discharged from the skilled rehab facility, please have the facility set up the patient's Home Health Physical Therapy prior to being released. Also, the skilled facility will be responsible for providing the patient with their medications at time of release from the facility to include their pain medication, the muscle relaxants, and their blood thinner medication. If the patient is still at the rehab facility   at time of the two week follow up appointment, the skilled rehab facility will also need to assist the patient in arranging follow up appointment in our office and any transportation needs. ° °MAKE SURE YOU:  °Understand these instructions.  °Get help right away if you are not doing well or get worse.  ° ° °DENTAL ANTIBIOTICS: ° °In most cases prophylactic antibiotics for Dental procdeures after total joint surgery are not necessary. ° °Exceptions are as follows: ° °1. History of prior total joint infection ° °2. Severely immunocompromised (Organ Transplant, cancer chemotherapy, Rheumatoid biologic °meds such as Humera) ° °3. Poorly controlled diabetes (A1C &gt; 8.0, blood glucose over 200) ° °If you have one of these conditions, contact your surgeon for an antibiotic prescription, prior to your °dental procedure.  ° ° °Pick up stool softner and laxative for home use following surgery while on pain medications. °Do not submerge incision under water. °Please use good hand washing techniques while changing dressing each day. °May shower starting three days after surgery. °Please use a clean towel to pat the incision dry following showers. °Continue to use ice for pain and swelling after surgery. °Do not use any lotions or creams on the incision until instructed by your surgeon. ° °

## 2022-05-16 NOTE — Op Note (Signed)
OPERATIVE REPORT- TOTAL HIP ARTHROPLASTY   PREOPERATIVE DIAGNOSIS: Osteoarthritis of the Right hip.   POSTOPERATIVE DIAGNOSIS: Osteoarthritis of the Right  hip.   PROCEDURE: Right total hip arthroplasty, anterior approach.   SURGEON: Ollen Gross, MD   ASSISTANT: Leilani Able, PA-C  ANESTHESIA:  Spinal  ESTIMATED BLOOD LOSS:-350 mL    DRAINS: None  COMPLICATIONS: None   CONDITION: PACU - hemodynamically stable.   BRIEF CLINICAL NOTE: Brenda Short is a 66 y.o. female who has advanced end-  stage arthritis of their Right  hip with progressively worsening pain and  dysfunction.The patient has failed nonoperative management and presents for  total hip arthroplasty.   PROCEDURE IN DETAIL: After successful administration of spinal  anesthetic, the traction boots for the [facility] bed were placed on both  feet and the patient was placed onto the [facility] bed, boots placed into the leg  holders. The Right hip was then isolated from the perineum with plastic  drapes and prepped and draped in the usual sterile fashion. ASIS and  greater trochanter were marked and a oblique incision was made, starting  at about 1 cm lateral and 2 cm distal to the ASIS and coursing towards  the anterior cortex of the femur. The skin was cut with a 10 blade  through subcutaneous tissue to the level of the fascia overlying the  tensor fascia lata muscle. The fascia was then incised in line with the  incision at the junction of the anterior third and posterior 2/3rd. The  muscle was teased off the fascia and then the interval between the TFL  and the rectus was developed. The Hohmann retractor was then placed at  the top of the femoral neck over the capsule. The vessels overlying the  capsule were cauterized and the fat on top of the capsule was removed.  A Hohmann retractor was then placed anterior underneath the rectus  femoris to give exposure to the entire anterior capsule. A T-shaped   capsulotomy was performed. The edges were tagged and the femoral head  was identified.       Osteophytes are removed off the superior acetabulum.  The femoral neck was then cut in situ with an oscillating saw. Traction  was then applied to the left lower extremity utilizing the [facility]  traction. The femoral head was then removed. Retractors were placed  around the acetabulum and then circumferential removal of the labrum was  performed. Osteophytes were also removed. Reaming starts at 47 mm to  medialize and  Increased in 2 mm increments to 49 mm. We reamed in  approximately 40 degrees of abduction, 20 degrees anteversion. A 50 mm  pinnacle acetabular shell was then impacted in anatomic position under  fluoroscopic guidance with excellent purchase. We did not need to place  any additional dome screws. A 32 mm neutral + 4 marathon liner was then  placed into the acetabular shell.       The femoral lift was then placed along the lateral aspect of the femur  just distal to the vastus ridge. The leg was  externally rotated and capsule  was stripped off the inferior aspect of the femoral neck down to the  level of the lesser trochanter, this was done with electrocautery. The femur was lifted after this was performed. The  leg was then placed in an extended and adducted position essentially delivering the femur. We also removed the capsule superiorly and the piriformis from the piriformis fossa to  gain excellent exposure of the  proximal femur. Rongeur was used to remove some cancellous bone to get  into the lateral portion of the proximal femur for placement of the  initial starter reamer. The starter broaches was placed  the starter broach  and was shown to go down the center of the canal. Broaching  with the Actis system was then performed starting at size 0  coursing  Up to size 5. A size 5 had excellent torsional and rotational  and axial stability. The trial standard offset neck was then  placed  with a 32 + 1 trial head. The hip was then reduced. We confirmed that  the stem was in the canal both on AP and lateral x-rays. It also has excellent sizing. The hip was reduced with outstanding stability through full extension and full external rotation.. AP pelvis was taken and the leg lengths were measured and found to be equal. Hip was then dislocated again and the femoral head and neck removed. The  femoral broach was removed. Size 5 Actis stem with a standard offset  neck was then impacted into the femur following native anteversion. Has  excellent purchase in the canal. Excellent torsional and rotational and  axial stability. It is confirmed to be in the canal on AP and lateral  fluoroscopic views. The 32 + 1 ceramic head was placed and the hip  reduced with outstanding stability. Again AP pelvis was taken and it  confirmed that the leg lengths were equal. The wound was then copiously  irrigated with saline solution and the capsule reattached and repaired  with Ethibond suture. 30 ml of .25% Bupivicaine was  injected into the capsule and into the edge of the tensor fascia lata as well as subcutaneous tissue. The fascia overlying the tensor fascia lata was then closed with a running #1 V-Loc. Subcu was closed with interrupted 2-0 Vicryl and subcuticular running 4-0 Monocryl. Incision was cleaned  and dried. Steri-Strips and a bulky sterile dressing applied. The patient was awakened and transported to  recovery in stable condition.        Please note that a surgical assistant was a medical necessity for this procedure to perform it in a safe and expeditious manner. Assistant was necessary to provide appropriate retraction of vital neurovascular structures and to prevent femoral fracture and allow for anatomic placement of the prosthesis.  Ollen Gross, M.D.

## 2022-05-16 NOTE — Anesthesia Procedure Notes (Signed)
Spinal  Patient location during procedure: OR Start time: 05/16/2022 2:44 PM End time: 05/16/2022 2:47 PM Reason for block: surgical anesthesia Staffing Performed: anesthesiologist  Anesthesiologist: Kaylyn Layer, MD Performed by: Kaylyn Layer, MD Authorized by: Kaylyn Layer, MD   Preanesthetic Checklist Completed: patient identified, IV checked, risks and benefits discussed, surgical consent, monitors and equipment checked, pre-op evaluation and timeout performed Spinal Block Patient position: sitting Prep: DuraPrep and site prepped and draped Patient monitoring: continuous pulse ox, blood pressure and heart rate Approach: midline Location: L4-5 Injection technique: single-shot Needle Needle type: Pencan  Needle gauge: 24 G Needle length: 9 cm Assessment Events: CSF return and second provider Additional Notes Risks, benefits, and alternative discussed. Patient gave consent to procedure. Prepped and draped in sitting position. Patient sedated but responsive to voice. First attempts at L3-4 by CRNA not successful. One attempt by myself at L4-5. Lumbar dextroscoliosis noted. Clear CSF obtained after one needle pass. Positive terminal aspiration. No pain or paraesthesias with injection. Patient tolerated procedure well. Vital signs stable. Amalia Greenhouse, MD

## 2022-05-16 NOTE — Anesthesia Postprocedure Evaluation (Signed)
Anesthesia Post Note  Patient: Brenda Short  Procedure(s) Performed: TOTAL HIP ARTHROPLASTY ANTERIOR APPROACH (Right: Hip)     Patient location during evaluation: PACU Anesthesia Type: Spinal Level of consciousness: awake and alert Pain management: pain level controlled Vital Signs Assessment: post-procedure vital signs reviewed and stable Respiratory status: spontaneous breathing, nonlabored ventilation and respiratory function stable Cardiovascular status: blood pressure returned to baseline Postop Assessment: no apparent nausea or vomiting, spinal receding, no headache and no backache Anesthetic complications: no   No notable events documented.  Last Vitals:  Vitals:   05/16/22 1700 05/16/22 1715  BP: (!) 122/93 (!) 128/98  Pulse: 74 75  Resp: 18 13  Temp:    SpO2: 95% 100%    Last Pain:  Vitals:   05/16/22 1700  TempSrc:   PainSc: 0-No pain                 Shanda Howells

## 2022-05-17 DIAGNOSIS — M1611 Unilateral primary osteoarthritis, right hip: Secondary | ICD-10-CM | POA: Diagnosis not present

## 2022-05-17 LAB — CBC
HCT: 32 % — ABNORMAL LOW (ref 36.0–46.0)
Hemoglobin: 10.7 g/dL — ABNORMAL LOW (ref 12.0–15.0)
MCH: 31.2 pg (ref 26.0–34.0)
MCHC: 33.4 g/dL (ref 30.0–36.0)
MCV: 93.3 fL (ref 80.0–100.0)
Platelets: 290 10*3/uL (ref 150–400)
RBC: 3.43 MIL/uL — ABNORMAL LOW (ref 3.87–5.11)
RDW: 12.4 % (ref 11.5–15.5)
WBC: 10.1 10*3/uL (ref 4.0–10.5)
nRBC: 0 % (ref 0.0–0.2)

## 2022-05-17 LAB — BASIC METABOLIC PANEL
Anion gap: 9 (ref 5–15)
BUN: 18 mg/dL (ref 8–23)
CO2: 26 mmol/L (ref 22–32)
Calcium: 8.5 mg/dL — ABNORMAL LOW (ref 8.9–10.3)
Chloride: 101 mmol/L (ref 98–111)
Creatinine, Ser: 1.03 mg/dL — ABNORMAL HIGH (ref 0.44–1.00)
GFR, Estimated: 59 mL/min — ABNORMAL LOW (ref >60)
Glucose, Bld: 165 mg/dL — ABNORMAL HIGH (ref 70–99)
Potassium: 4 mmol/L (ref 3.5–5.1)
Sodium: 136 mmol/L (ref 135–145)

## 2022-05-17 MED ORDER — ASPIRIN 325 MG PO TBEC: 325.0000 mg | DELAYED_RELEASE_TABLET | Freq: Two times a day (BID) | ORAL | 0 refills | Status: AC

## 2022-05-17 MED ORDER — HYDROCODONE-ACETAMINOPHEN 5-325 MG PO TABS: 1.0000 | ORAL_TABLET | Freq: Four times a day (QID) | ORAL | 0 refills | Status: DC | PRN

## 2022-05-17 MED ORDER — METHOCARBAMOL 500 MG PO TABS: 500.0000 mg | ORAL_TABLET | Freq: Four times a day (QID) | ORAL | 0 refills | Status: DC | PRN

## 2022-05-17 NOTE — Evaluation (Signed)
Physical Therapy Evaluation Patient Details Name: Brenda Short MRN: [id number] DOB: 1956/02/03 Today's Date: 05/17/2022  History of Present Illness  66 yo female ,S/P R THA-DA on 05/16/22. PMH: RTSA, LTHA  Clinical Impression  The patient admitted for above surgery. Patient  has met PT goals for mobility and will DC home.        Recommendations for follow up therapy are one component of a multi-disciplinary discharge planning process, led by the attending physician.  Recommendations may be updated based on patient status, additional functional criteria and insurance authorization.  Follow Up Recommendations Follow physician's recommendations for discharge plan and follow up therapies      Assistance Recommended at Discharge Set up Supervision/Assistance  Patient can return home with the following  Help with stairs or ramp for entrance;A little help with bathing/dressing/bathroom;Assist for transportation;Assistance with cooking/housework    Equipment Recommendations None recommended by PT  Recommendations for Other Services       Functional Status Assessment Patient has had a recent decline in their functional status and demonstrates the ability to make significant improvements in function in a reasonable and predictable amount of time.     Precautions / Restrictions Precautions Precautions: Fall Restrictions Weight Bearing Restrictions: No      Mobility  Bed Mobility Overal bed mobility: Needs Assistance Bed Mobility: Supine to Sit, Sit to Supine     Supine to sit: Supervision, HOB elevated Sit to supine: Supervision   General bed mobility comments: used belt to move to bed edge and place the  RLE onto bed    Transfers Overall transfer level: Needs assistance Equipment used: Rolling walker (2 wheels) Transfers: Sit to/from Stand Sit to Stand: Supervision           General transfer comment: cues for right leg position     Ambulation/Gait Ambulation/Gait assistance: Min guard Gait Distance (Feet): 100 Feet Assistive device: Rolling walker (2 wheels) Gait Pattern/deviations: Step-through pattern, Step-to pattern, Antalgic       General Gait Details: cues for sequence  Stairs Stairs: Yes Stairs assistance: Min guard Stair Management: One rail Right, Step to pattern Number of Stairs: 3 General stair comments: simulatyed  use of a crutche which patient has been using recently  Wheelchair Mobility    Modified Rankin (Stroke Patients Only)       Balance Overall balance assessment: No apparent balance deficits (not formally assessed)                                           Pertinent Vitals/Pain Pain Assessment Pain Assessment: 0-10 Pain Score: 5  Pain Location: right hip and  thigh Pain Descriptors / Indicators: Aching, Discomfort Pain Intervention(s): Monitored during session, Premedicated before session, Ice applied    Home Living Family/patient expects to be discharged to:: Private residence Living Arrangements: Spouse/significant other Available Help at Discharge: Family;Available 24 hours/day Type of Home: House Home Access: Stairs to enter Entrance Stairs-Rails: None Entrance Stairs-Number of Steps: 4 Alternate Level Stairs-Number of Steps: 10+5 Home Layout: Two level Home Equipment: Conservation officer, nature (2 wheels);Crutches;BSC/3in1      Prior Function Prior Level of Function : Independent/Modified Independent             Mobility Comments: has been using 1 crutch or a SPC, was told  to limit WB on R LE diue to microfractures on hip ADLs Comments: IADLs  Hand Dominance        Extremity/Trunk Assessment   Upper Extremity Assessment Upper Extremity Assessment: Overall WFL for tasks assessed    Lower Extremity Assessment Lower Extremity Assessment: RLE deficits/detail RLE Deficits / Details: able to advance to step forward    Cervical / Trunk  Assessment Cervical / Trunk Assessment: Normal  Communication   Communication: No difficulties  Cognition Arousal/Alertness: Awake/alert Behavior During Therapy: WFL for tasks assessed/performed Overall Cognitive Status: Within Functional Limits for tasks assessed                                          General Comments      Exercises Total Joint Exercises Ankle Circles/Pumps: AROM, 20 reps, Supine, Both Quad Sets: AROM, Both, 10 reps, Supine Short Arc Quad: AROM, Supine, Right, 10 reps Heel Slides: AAROM, Right, Supine, 5 reps Hip ABduction/ADduction: AAROM, Right, Supine, 10 reps   Assessment/Plan    PT Assessment All further PT needs can be met in the next venue of care  PT Problem List Decreased strength;Decreased mobility;Decreased range of motion;Decreased activity tolerance;Pain       PT Treatment Interventions      PT Goals (Current goals can be found in the Care Plan section)  Acute Rehab PT Goals Patient Stated Goal: go home PT Goal Formulation: All assessment and education complete, DC therapy    Frequency       Co-evaluation               AM-PAC PT "6 Clicks" Mobility  Outcome Measure Help needed turning from your back to your side while in a flat bed without using bedrails?: None Help needed moving from lying on your back to sitting on the side of a flat bed without using bedrails?: None Help needed moving to and from a bed to a chair (including a wheelchair)?: A Little Help needed standing up from a chair using your arms (e.g., wheelchair or bedside chair)?: A Little Help needed to walk in hospital room?: A Little Help needed climbing 3-5 steps with a railing? : A Little 6 Click Score: 20    End of Session Equipment Utilized During Treatment: Gait belt Activity Tolerance: Patient tolerated treatment well Patient left: in bed;with call bell/phone within reach;with family/visitor present Nurse Communication: Mobility  status PT Visit Diagnosis: Unsteadiness on feet (R26.81);Pain Pain - Right/Left: Right Pain - part of body: Leg;Hip    Time: 6962-9528 PT Time Calculation (min) (ACUTE ONLY): 34 min   Charges:   PT Evaluation $PT Eval Low Complexity: 1 Low PT Treatments $Gait Training: 8-22 mins        [place] Office [phone] Weekend [phone]  Claretha Cooper 05/17/2022, 12:11 PM

## 2022-05-17 NOTE — TOC Transition Note (Signed)
Transition of Care [facility]) - CM/SW Discharge Note   Patient Details  Name: Brenda Short MRN: [id number] Date of Birth: 06-03-1956  Transition of Care [facility]) CM/SW Contact:  Lennart Pall, LCSW Phone Number: 05/17/2022, 9:42 AM   Clinical Narrative:     Met briefly with pt and confirming she has needed DME at home. Plan for HEP.  No TOC needs.  Final next level of care: Home/Self Care Barriers to Discharge: No Barriers Identified   Patient Goals and CMS Choice Patient states their goals for this hospitalization and ongoing recovery are:: return home      Discharge Placement                       Discharge Plan and Services                DME Arranged: N/A DME Agency: NA       HH Arranged: NA HH Agency: NA        Social Determinants of Health (SDOH) Interventions     Readmission Risk Interventions     No data to display

## 2022-05-17 NOTE — Progress Notes (Signed)
Patient discharged to home w/ family. Given all belongings, instructions. Verbalized understanding of all instructions. Escorted to pov via w/c. 

## 2022-05-17 NOTE — Progress Notes (Signed)
   Subjective: 1 Day Post-Op Procedure(s) (LRB): TOTAL HIP ARTHROPLASTY ANTERIOR APPROACH (Right) Patient reports pain as mild.   Patient seen in rounds by Dr. Lequita Halt. Patient with increased pain last night, feeling better this morning. Denies chest pain or SOB. Foley catheter removed this AM. We will begin therapy today.   Objective: Vital signs in last 24 hours: Temp:  [97.1 F (36.2 C)-98 F (36.7 C)] 98 F (36.7 C) (12/12 0520) Pulse Rate:  [40-100] 83 (12/12 0520) Resp:  [12-20] 16 (12/12 0520) BP: (82-128)/(63-98) 101/78 (12/12 0520) SpO2:  [92 %-100 %] 95 % (12/12 0520) Weight:  [75.3 kg] 75.3 kg (12/11 1825)  Intake/Output from previous day:  Intake/Output Summary (Last 24 hours) at 05/17/2022 0755 Last data filed at 05/17/2022 0552 Gross per 24 hour  Intake 3297.84 ml  Output 2375 ml  Net 922.84 ml     Intake/Output this shift: No intake/output data recorded.  Labs: Recent Labs    05/17/22 0328  HGB 10.7*   Recent Labs    05/17/22 0328  WBC 10.1  RBC 3.43*  HCT 32.0*  PLT 290   Recent Labs    05/17/22 0328  NA 136  K 4.0  CL 101  CO2 26  BUN 18  CREATININE 1.03*  GLUCOSE 165*  CALCIUM 8.5*   No results for input(s): "LABPT", "INR" in the last 72 hours.  Exam: General - Patient is Alert and Oriented Extremity - Neurologically intact Neurovascular intact Sensation intact distally Dorsiflexion/Plantar flexion intact Dressing - dressing C/D/I Motor Function - intact, moving foot and toes well on exam.   Past Medical History:  Diagnosis Date   Allergy    Anxiety    Arthritis    Fibromyalgia    GERD (gastroesophageal reflux disease)    Glenoid labral tear    Headache(784.0)    TAKES ATENOLOL   Hypertension    MVP (mitral valve prolapse)    PONV (postoperative nausea and vomiting)    WITH [facility] DRIP/ no problems 9'13 with hip scope    Assessment/Plan: 1 Day Post-Op Procedure(s) (LRB): TOTAL HIP ARTHROPLASTY ANTERIOR  APPROACH (Right) Principal Problem:   Osteoarthritis of right hip  Estimated body mass index is 25.62 kg/m as calculated from the following:   Height as of this encounter: 5' 7.5" (1.715 m).   Weight as of this encounter: 75.3 kg. Advance diet Up with therapy D/C IV fluids  DVT Prophylaxis - Aspirin WBAT. Continue therapy.  Plan is to go Home after hospital stay. Plan for discharge with HEP later today if progresses with therapy and meeting goals. Follow-up in the office in 2 weeks.  The PDMP database was reviewed today prior to any opioid medications being prescribed to this patient.  Arther Abbott, PA-C Orthopedic Surgery [phone] 05/17/2022, 7:55 AM

## 2022-05-17 NOTE — Plan of Care (Signed)
  Problem: Activity: Goal: Ability to tolerate increased activity will improve Outcome: Progressing   Problem: Pain Management: Goal: Pain level will decrease with appropriate interventions Outcome: Progressing   Problem: Safety: Goal: Ability to remain free from injury will improve Outcome: Progressing   

## 2022-05-18 ENCOUNTER — Encounter (HOSPITAL_COMMUNITY): Payer: Self-pay | Admitting: Orthopedic Surgery

## 2022-05-24 NOTE — Discharge Summary (Signed)
Patient ID: Brenda Short MRN: [id number] DOB/AGE: 1955/12/10 66 y.o.  Admit date: 05/16/2022 Discharge date: 05/17/2022  Admission Diagnoses:  Principal Problem:   Osteoarthritis of right hip   Discharge Diagnoses:  Same  Past Medical History:  Diagnosis Date   Allergy    Anxiety    Arthritis    Fibromyalgia    GERD (gastroesophageal reflux disease)    Glenoid labral tear    Headache(784.0)    TAKES ATENOLOL   Hypertension    MVP (mitral valve prolapse)    PONV (postoperative nausea and vomiting)    WITH [facility] DRIP/ no problems 9'13 with hip scope    Surgeries: Procedure(s): TOTAL HIP ARTHROPLASTY ANTERIOR APPROACH on 05/16/2022   Consultants:   Discharged Condition: Improved  Hospital Course: Brenda Short is an 66 y.o. female who was admitted 05/16/2022 for operative treatment ofOsteoarthritis of right hip. Patient has severe unremitting pain that affects sleep, daily activities, and work/hobbies. After pre-op clearance the patient was taken to the operating room on 05/16/2022 and underwent  Procedure(s): TOTAL HIP ARTHROPLASTY ANTERIOR APPROACH.    Patient was given perioperative antibiotics:  Anti-infectives (From admission, onward)    Start     Dose/Rate Route Frequency Ordered Stop   05/17/22 0600  ceFAZolin (ANCEF) IVPB 2g/100 mL premix        2 g 200 mL/hr over 30 Minutes Intravenous On call to O.R. 05/16/22 1231 05/16/22 1506   05/16/22 2030  ceFAZolin (ANCEF) IVPB 2g/100 mL premix        2 g 200 mL/hr over 30 Minutes Intravenous Every 6 hours 05/16/22 1815 05/17/22 0327        Patient was given sequential compression devices, early ambulation, and chemoprophylaxis to prevent DVT.  Patient benefited maximally from hospital stay and there were no complications.    Recent vital signs: No data found.   Recent laboratory studies: No results for input(s): "WBC", "HGB", "HCT", "PLT", "NA", "K", "CL", "CO2", "BUN", "CREATININE", "GLUCOSE",  "INR", "CALCIUM" in the last 72 hours.  Invalid input(s): "PT", "2"   Discharge Medications:   Allergies as of 05/17/2022       Reactions   Morphine And Related Nausea And Vomiting        Medication List     STOP taking these medications    naproxen sodium 220 MG tablet Commonly known as: ALEVE       TAKE these medications    acetaminophen 500 MG tablet Commonly known as: TYLENOL Take 1,000 mg by mouth every 6 (six) hours as needed for moderate pain.   albuterol 108 (90 Base) MCG/ACT inhaler Commonly known as: VENTOLIN HFA Inhale 1-2 puffs into the lungs every 6 (six) hours as needed for wheezing or shortness of breath.   amitriptyline 25 MG tablet Commonly known as: ELAVIL Take 25 mg by mouth at bedtime.   amoxicillin 500 MG tablet Commonly known as: AMOXIL Take 4 tablets (2 g) 30 mins to an hour prior to dental procedure.   aspirin EC 325 MG tablet Take 1 tablet (325 mg total) by mouth 2 (two) times daily for 20 days. Then take one 81 mg aspirin once a day for three weeks. Then discontinue aspirin.   atenolol 25 MG tablet Commonly known as: TENORMIN Take 25 mg by mouth every morning.   cholecalciferol 25 MCG (1000 UNIT) tablet Commonly known as: VITAMIN D3 Take 1,000 Units by mouth daily.   docusate sodium 100 MG capsule Commonly known as: COLACE Take 100 mg  by mouth daily as needed for moderate constipation.   gabapentin 300 MG capsule Commonly known as: NEURONTIN Take 300-600 mg by mouth See admin instructions. 300 mg in the morning, and 600 mg at bedtime   HYDROcodone-acetaminophen 5-325 MG tablet Commonly known as: NORCO/VICODIN Take 1-2 tablets by mouth every 6 (six) hours as needed for moderate pain or severe pain. What changed:  how much to take when to take this reasons to take this   methocarbamol 500 MG tablet Commonly known as: ROBAXIN Take 1 tablet (500 mg total) by mouth every 6 (six) hours as needed for muscle spasms.    omeprazole 40 MG capsule Commonly known as: PRILOSEC Take 40 mg by mouth daily before breakfast.   PARoxetine 30 MG tablet Commonly known as: PAXIL Take 30 mg by mouth at bedtime.   polyethylene glycol powder 17 GM/SCOOP powder Commonly known as: GLYCOLAX/MIRALAX Take 17 g by mouth daily as needed for mild constipation.   pramipexole 1 MG tablet Commonly known as: MIRAPEX Take 2 mg by mouth at bedtime.   rizatriptan 10 MG tablet Commonly known as: MAXALT Take 10 mg by mouth daily as needed for migraine.   telmisartan-hydrochlorothiazide 80-25 MG tablet Commonly known as: MICARDIS HCT Take 1 tablet by mouth daily.   trolamine salicylate 10 % cream Commonly known as: ASPERCREME Apply 1 application topically as needed for muscle pain.               Discharge Care Instructions  (From admission, onward)           Start     Ordered   05/17/22 0000  Weight bearing as tolerated        05/17/22 0758   05/17/22 0000  Change dressing       Comments: You have an adhesive waterproof bandage over the incision. Leave this in place until your first follow-up appointment. Once you remove this you will not need to place another bandage.   05/17/22 0758            Diagnostic Studies: DG Pelvis Portable  Result Date: 05/16/2022 CLINICAL DATA:  Status post hip replacement. EXAM: PORTABLE PELVIS 1-2 VIEWS COMPARISON:  AP pelvis 04/06/2021 FINDINGS: Interval total right hip arthroplasty. No perihardware lucency is seen to indicate hardware failure or loosening. Expected lateral right hip postoperative subcutaneous air. Status post total left hip arthroplasty without visualization of the distal femoral stem but no hardware complication seen. Moderate pubic symphysis joint space narrowing, subchondral sclerosis, and mild peripheral osteophytosis osteoarthritis. No acute fracture or dislocation. IMPRESSION: Interval total right hip arthroplasty without evidence of hardware  complication. Electronically Signed   By: Neita Garnet M.D.   On: 05/16/2022 17:42   DG HIP UNILAT WITH PELVIS 1V RIGHT  Result Date: 05/16/2022 CLINICAL DATA:  Right hip replacement. EXAM: DG HIP (WITH OR WITHOUT PELVIS) 1V RIGHT; DG C-ARM 1-60 MIN-NO REPORT Radiation exposure index: 0.8470 mGy. COMPARISON:  April 06, 2021. FINDINGS: Eleven fluoroscopic images were obtained of the right hip. These demonstrate the right acetabular and femoral components to be well situated. IMPRESSION: Fluoroscopic guidance provided during right total hip arthroplasty. Electronically Signed   By: Lupita Raider M.D.   On: 05/16/2022 16:41   DG C-Arm 1-60 Min-No Report  Result Date: 05/16/2022 Fluoroscopy was utilized by the requesting physician.  No radiographic interpretation.    Disposition: Discharge disposition: 01-Home or Self Care       Discharge Instructions     Call MD /  Call 911   Complete by: As directed    If you experience chest pain or shortness of breath, CALL 911 and be transported to the hospital emergency room.  If you develope a fever above 101 F, pus (white drainage) or increased drainage or redness at the wound, or calf pain, call your surgeon's office.   Change dressing   Complete by: As directed    You have an adhesive waterproof bandage over the incision. Leave this in place until your first follow-up appointment. Once you remove this you will not need to place another bandage.   Constipation Prevention   Complete by: As directed    Drink plenty of fluids.  Prune juice may be helpful.  You may use a stool softener, such as Colace (over the counter) 100 mg twice a day.  Use MiraLax (over the counter) for constipation as needed.   Diet - low sodium heart healthy   Complete by: As directed    Do not sit on low chairs, stoools or toilet seats, as it may be difficult to get up from low surfaces   Complete by: As directed    Driving restrictions   Complete by: As directed     No driving for two weeks   Post-operative opioid taper instructions:   Complete by: As directed    POST-OPERATIVE OPIOID TAPER INSTRUCTIONS: It is important to wean off of your opioid medication as soon as possible. If you do not need pain medication after your surgery it is ok to stop day one. Opioids include: Codeine, Hydrocodone(Norco, Vicodin), Oxycodone(Percocet, oxycontin) and hydromorphone amongst others.  Long term and even short term use of opiods can cause: Increased pain response Dependence Constipation Depression Respiratory depression And more.  Withdrawal symptoms can include Flu like symptoms Nausea, vomiting And more Techniques to manage these symptoms Hydrate well Eat regular healthy meals Stay active Use relaxation techniques(deep breathing, meditating, yoga) Do Not substitute Alcohol to help with tapering If you have been on opioids for less than two weeks and do not have pain than it is ok to stop all together.  Plan to wean off of opioids This plan should start within one week post op of your joint replacement. Maintain the same interval or time between taking each dose and first decrease the dose.  Cut the total daily intake of opioids by one tablet each day Next start to increase the time between doses. The last dose that should be eliminated is the evening dose.      TED hose   Complete by: As directed    Use stockings (TED hose) for three weeks on both leg(s).  You may remove them at night for sleeping.   Weight bearing as tolerated   Complete by: As directed         Follow-up Information     Ollen Gross, MD. Go on 06/02/2022.   Specialty: Orthopedic Surgery Why: You are scheduled for a follow up appointment on 06-02-22 at 10:15 am. Contact information: [address] [place] [address] [phone]                  Signed: Arther Abbott 05/24/2022, 8:22 AM

## 2022-09-09 ENCOUNTER — Encounter (HOSPITAL_BASED_OUTPATIENT_CLINIC_OR_DEPARTMENT_OTHER): Payer: Self-pay | Admitting: Obstetrics & Gynecology

## 2023-01-09 ENCOUNTER — Encounter (HOSPITAL_BASED_OUTPATIENT_CLINIC_OR_DEPARTMENT_OTHER): Payer: Self-pay | Admitting: Obstetrics & Gynecology

## 2023-01-10 ENCOUNTER — Encounter (HOSPITAL_BASED_OUTPATIENT_CLINIC_OR_DEPARTMENT_OTHER): Payer: Self-pay | Admitting: Advanced Practice Midwife

## 2023-01-10 ENCOUNTER — Ambulatory Visit (INDEPENDENT_AMBULATORY_CARE_PROVIDER_SITE_OTHER): Payer: Medicare Other | Admitting: Advanced Practice Midwife

## 2023-01-10 VITALS — BP 103/80 | HR 74 | Ht 68.5 in | Wt 168.6 lb

## 2023-01-10 DIAGNOSIS — N368 Other specified disorders of urethra: Secondary | ICD-10-CM

## 2023-01-10 DIAGNOSIS — N952 Postmenopausal atrophic vaginitis: Secondary | ICD-10-CM

## 2023-01-10 DIAGNOSIS — N920 Excessive and frequent menstruation with regular cycle: Secondary | ICD-10-CM | POA: Diagnosis not present

## 2023-01-10 NOTE — Progress Notes (Unsigned)
Subjective:     Brenda Short is a 67 y.o. female here at [facility] for a gyn problem visit.  Current complaints: episode of spotting after running to catch flight at the airport recently, no bleeding since then. Pt looked at the area and saw something protruding from her urethra.  There is no pain.  Flowsheet Row Office Visit from 01/10/2023 in [facility] for [facility]  PHQ-2 Total Score 0       Health Maintenance Due  Topic Date Due   Medicare Annual Wellness (AWV)  Never done   COVID-19 Vaccine (1) Never done   Hepatitis C Screening  Never done   DTaP/Tdap/Td (1 - Tdap) Never done   Colonoscopy  Never done   Zoster Vaccines- Shingrix (2 of 2) 05/31/2018   Pneumonia Vaccine 76+ Years old (1 of 1 - PCV) Never done   DEXA SCAN  Never done   INFLUENZA VACCINE  01/05/2023       Gynecologic History No LMP recorded. Patient has had a hysterectomy. Contraception: status post hysterectomy Last Pap: 2023. Results were: normal Last mammogram: 08/2021. Results were: normal  Obstetric History OB History  Gravida Para Term Preterm AB Living  3 3 3         SAB IAB Ectopic Multiple Live Births               # Outcome Date GA Lbr Len/2nd Weight Sex Type Anes PTL Lv  3 Term      Vag-Spont     2 Term      Vag-Spont     1 Term      Vag-Spont        The following portions of the patient's history were reviewed and updated as appropriate: allergies, current medications, past family history, past medical history, past social history, past surgical history, and problem list.  Review of Systems Pertinent items noted in HPI and remainder of comprehensive ROS otherwise negative.    Objective:  BP 103/80 (BP Location: Right Arm, Patient Position: Sitting, Cuff Size: Large)   Pulse 74   Ht 5' 8.5" (1.74 m) Comment: Reported  Wt 168 lb 9.6 oz (76.5 kg)   BMI 25.26 kg/m   VS reviewed, nursing note reviewed,  Constitutional: well developed,  well nourished, no distress HEENT: normocephalic, thyroid without enlargement or mass HEART: RRR, no murmurs rubs/gallops RESP: clear and equal to auscultation bilaterally in all lobes  Breast Exam:  Deferred  Abdomen: soft Neuro: alert and oriented x 3 Skin: warm, dry Psych: affect normal Pelvic exam: Performed: SSE: Vaginal cuff wnl, no evidence of bleeding, normal vaginal walls with atrophic changes.  On visual inspection , light red tissue, smooth in appearance, visible at urethral opening, but does not protrude out of urethra. No friability to touch with cotton swab.         Assessment/Plan:   1. Urethral prolapse --Prolapse vs mass (polyp) protruding slightly from urethra.  Without evidence of bleeding today, unsure if bleeding from urethra, or related to atrophic vaginal changes.  See management below.  --F/U with Dr Hyacinth Meeker or urogynecology, first available for further management --Pt to contact office if another episode of bleeding occurs  2. Vaginal atrophy --Pt has used topical estrogen before, but wants to avoid if it is not needed.   --Discussed new products with hyaluronic acid, and pt to try.  --Rx renewed for vaginal Estrace twice weekly, prescribed 08/2021 by Dr Hyacinth Meeker  3.  Spotting      No follow-ups on file.   Sharen Counter, CNM 6:55 PM

## 2023-01-12 ENCOUNTER — Encounter (HOSPITAL_BASED_OUTPATIENT_CLINIC_OR_DEPARTMENT_OTHER): Payer: Self-pay | Admitting: Advanced Practice Midwife

## 2023-01-12 MED ORDER — ESTRADIOL 0.1 MG/GM VA CREA: 1.0000 | TOPICAL_CREAM | VAGINAL | 12 refills | Status: DC

## 2023-01-15 ENCOUNTER — Ambulatory Visit (HOSPITAL_BASED_OUTPATIENT_CLINIC_OR_DEPARTMENT_OTHER)
Admission: RE | Admit: 2023-01-15 | Discharge: 2023-01-15 | Disposition: A | Discharge status: Home / Self Care | Payer: Medicare Other | Source: Ambulatory Visit | Attending: Obstetrics & Gynecology | Admitting: Obstetrics & Gynecology

## 2023-01-15 DIAGNOSIS — Z1231 Encounter for screening mammogram for malignant neoplasm of breast: Secondary | ICD-10-CM | POA: Insufficient documentation

## 2023-01-27 ENCOUNTER — Ambulatory Visit (INDEPENDENT_AMBULATORY_CARE_PROVIDER_SITE_OTHER): Payer: Medicare Other | Admitting: Obstetrics and Gynecology

## 2023-01-27 ENCOUNTER — Encounter: Payer: Self-pay | Admitting: Obstetrics and Gynecology

## 2023-01-27 VITALS — BP 109/80 | HR 71 | Ht 67.0 in | Wt 170.0 lb

## 2023-01-27 DIAGNOSIS — N952 Postmenopausal atrophic vaginitis: Secondary | ICD-10-CM | POA: Diagnosis not present

## 2023-01-27 DIAGNOSIS — N816 Rectocele: Secondary | ICD-10-CM

## 2023-01-27 DIAGNOSIS — N993 Prolapse of vaginal vault after hysterectomy: Secondary | ICD-10-CM | POA: Diagnosis not present

## 2023-01-27 DIAGNOSIS — R350 Frequency of micturition: Secondary | ICD-10-CM

## 2023-01-27 DIAGNOSIS — N393 Stress incontinence (female) (male): Secondary | ICD-10-CM

## 2023-01-27 DIAGNOSIS — N8110 Cystocele, unspecified: Secondary | ICD-10-CM

## 2023-01-27 LAB — POCT URINALYSIS DIPSTICK
Bilirubin, UA: NEGATIVE
Blood, UA: NEGATIVE
Glucose, UA: NEGATIVE
Ketones, UA: NEGATIVE
Leukocytes, UA: NEGATIVE
Nitrite, UA: NEGATIVE
Protein, UA: NEGATIVE
Spec Grav, UA: 1.015 (ref 1.010–1.025)
Urobilinogen, UA: 0.2 E.U./dL
pH, UA: 7.5 (ref 5.0–8.0)

## 2023-01-27 MED ORDER — ESTRADIOL 10 MCG VA TABS: 10.0000 ug | ORAL_TABLET | Freq: Every day | VAGINAL | 6 refills | Status: DC

## 2023-01-27 NOTE — Patient Instructions (Signed)
Plan for pessary fitting  Use the estrogen inserts twice weekly and coconut oil on nights not using the estrogen

## 2023-01-27 NOTE — Progress Notes (Signed)
[facility] New Patient Evaluation and Consultation  Referring Provider: Cleatis Polka., MD PCP: Cleatis Polka., MD Date of Service: 01/27/2023  SUBJECTIVE Chief Complaint: New Patient (Initial Visit) Brenda Short is a 67 y.o. female is here for prolapse. )  History of Present Illness: Brenda Short is a 67 y.o. White or Caucasian female seen in consultation at the request of Dr. Clelia Croft for evaluation of prolapse.    Review of records significant for: Saw Lisa Leftwich-Kirby CNM and there was concern for Urethral prolapse vs. Mass.  Patient has previously been on estrogen cream but does not like using it.   Urinary Symptoms: Leaks urine with cough/ sneeze, laughing, exercise, with a full bladder, and with urgency Leaks few time(s) per months.  Pad use: 1 liners/ mini-pads per day.   She is not bothered by her UI symptoms.  Day time voids 3-8.  Nocturia: 1 times per night to void. Voiding dysfunction: she does not empty her bladder well.  does not use a catheter to empty bladder.  When urinating, she feels a weak stream and the need to urinate multiple times in a row Drinks: 48-60oz Water per day  UTIs: 0 UTI's in the last year.   Denies history of blood in urine, kidney or bladder stones, pyelonephritis, bladder cancer, and kidney cancer  Pelvic Organ Prolapse Symptoms:                  She Admits to a feeling of a bulge the vaginal area. It has been present for 15 years.  She Denies seeing a bulge.  This bulge is not bothersome.  Bowel Symptom: Bowel movements: 3-7 time(s) per week Stool consistency: soft  Straining: yes.  Splinting: no.  Incomplete evacuation: no.  She Denies accidental bowel leakage / fecal incontinence Bowel regimen: diet, fiber, and stool softener Last colonoscopy: Date 2020, Results WNL  Sexual Function Sexually active: yes.  Sexual orientation: Straight Pain with sex: Yes, has discomfort due to dryness  Pelvic  Pain Denies pelvic pain    Past Medical History:  Past Medical History:  Diagnosis Date   Allergy    Anxiety    Arthritis    Fibromyalgia    GERD (gastroesophageal reflux disease)    Glenoid labral tear    Headache(784.0)    TAKES ATENOLOL   Hypertension    MVP (mitral valve prolapse)    PONV (postoperative nausea and vomiting)    WITH DURAMORPH DRIP/ no problems 9'13 with hip scope     Past Surgical History:   Past Surgical History:  Procedure Laterality Date   BREAST SURGERY     DUCTS REMOVED FROM RT BREAST 2008   HIP ARTHROSCOPY     2010 L HIP ARTHROSCOPY    HIP ARTHROSCOPY  04/20/2011   Procedure: ARTHROSCOPY HIP;  Surgeon: Gus Rankin Aluisio;  Location: [facility];  Service: Orthopedics;  Laterality: Left;  debridement labral tear, chondroplasty   JOINT REPLACEMENT     TOTAL HIP ARTHROPLASTY  11/09/2011   Procedure: TOTAL HIP ARTHROPLASTY;  Surgeon: Loanne Drilling, MD;  Location: [facility];  Service: Orthopedics;  Laterality: Left;   TOTAL HIP ARTHROPLASTY Right 05/16/2022   Procedure: TOTAL HIP ARTHROPLASTY ANTERIOR APPROACH;  Surgeon: Ollen Gross, MD;  Location: [facility];  Service: Orthopedics;  Laterality: Right;   TOTAL SHOULDER ARTHROPLASTY Right 12/25/2018   TOTAL SHOULDER ARTHROPLASTY Right 12/25/2018   Procedure: RIGHT TOTAL SHOULDER ARTHROPLASTY;  Surgeon: Cammy Copa, MD;  Location: [facility];  Service: Orthopedics;  Laterality: Right;   TUBAL LIGATION     1986   VAGINAL HYSTERECTOMY       Past OB/GYN History: G3 P3 Vaginal deliveries: 3,  Forceps/ Vacuum deliveries: 0, Cesarean section: 0 Menopausal: Yes Last pap smear was prior to hysterectomy in 1994   Medications: She has a current medication list which includes the following prescription(s): acetaminophen, albuterol, amitriptyline, atenolol, cholecalciferol, docusate sodium, estradiol, gabapentin, omeprazole, paroxetine, pramipexole, rizatriptan, telmisartan-hydrochlorothiazide, trolamine  salicylate, and polyethylene glycol powder.   Allergies: Patient has No Known Allergies.   Social History:  Social History   Tobacco Use   Smoking status: Never   Smokeless tobacco: Never  Vaping Use   Vaping status: Never Used  Substance Use Topics   Alcohol use: Yes    Alcohol/week: 7.0 standard drinks of alcohol    Types: 7 Glasses of wine per week    Comment: 1-2 glasses of wine per day   Drug use: No    Relationship status: married She lives with husband.   She is not employed. Regular exercise: No History of abuse: No  Family History:   Family History  Problem Relation Age of Onset   Multiple myeloma Mother    Cancer Father    Hypertension Father    Prostate cancer Father    Colon cancer Father    Heart disease Other        No family history of     Review of Systems: Review of Systems  Constitutional:  Positive for malaise/fatigue. Negative for chills and fever.  Respiratory:  Positive for cough. Negative for shortness of breath.   Cardiovascular:  Negative for chest pain, palpitations and leg swelling.  Gastrointestinal:  Negative for abdominal pain, blood in stool, constipation and diarrhea.  Genitourinary:  Negative for dysuria and urgency.  Skin:  Negative for rash.  Neurological:  Positive for dizziness and headaches. Negative for weakness.  Endo/Heme/Allergies:  Bruises/bleeds easily.  Psychiatric/Behavioral:  Negative for depression and suicidal ideas. The patient is nervous/anxious.      OBJECTIVE Physical Exam: Vitals:   01/27/23 1035  BP: 109/80  Pulse: 71  Weight: 170 lb (77.1 kg)  Height: 5\' 7"  (1.702 m)    Physical Exam Constitutional:      Appearance: Normal appearance.  Abdominal:     General: Abdomen is flat.     Palpations: Abdomen is soft.  Skin:    General: Skin is warm and dry.  Neurological:     Mental Status: She is alert and oriented to person, place, and time.  Psychiatric:        Mood and Affect: Mood normal.         Behavior: Behavior normal.        Thought Content: Thought content normal.        Judgment: Judgment normal.      GU / Detailed Urogynecologic Evaluation:  Pelvic Exam: Normal external female genitalia; Bartholin's and Skene's glands normal in appearance; urethral meatus normal in appearance, no urethral masses or discharge.   CST: negative   s/p hysterectomy: Speculum exam reveals normal vaginal mucosa with  atrophy and normal vaginal cuff.  Adnexa normal adnexa.    With apex supported, anterior compartment defect was present  Pelvic floor strength II/V  Pelvic floor musculature: Right levator non-tender, Right obturator non-tender, Left levator non-tender, Left obturator non-tender  POP-Q:   POP-Q  -0.5  Aa   -0.5                                           Ba  -5.5                                              C   3                                            Gh  4.5                                            Pb  9.5                                            tvl   -1                                            Ap  -1                                            Bp                                                 D      Rectal Exam:  Normal sphincter tone, moderate distal rectocele, enterocoele not present, no rectal masses, noted dyssynergia when asking the patient to bear down.  Post-Void Residual (PVR) by Bladder Scan: In order to evaluate bladder emptying, we discussed obtaining a postvoid residual and she agreed to this procedure.  Procedure: The ultrasound unit was placed on the patient's abdomen in the suprapubic region after the patient had voided. A PVR of 21 ml was obtained by bladder scan.  Laboratory Results:  POC Urine: Negative for all components   ASSESSMENT AND PLAN Ms. Song is a 67 y.o. with:  1. Prolapse of anterior vaginal wall   2. Posterior vaginal wall prolapse   3. Vaginal vault  prolapse after hysterectomy   4. Urinary frequency   5. SUI (stress urinary incontinence, female)   6. Vaginal atrophy     POP:  Patient has stage II/IV Anterior, I/IV Apical, and I/IV Posterior vaginal prolapse. We briefly discussed surgical options including Sacrospinous ligament fixation and sacrocolpopexy. She would like to start with a pessary and see if this can manage her symptoms. Patient is well organized and mobile and should be able to remove and insert the pessary herself once we are able to find one that fits her well.   SUI/UUI Some  mild stress incontinence that is infrequent in nature. The pessary may be helpful with this if she is able to be fit with one with a knob. Patient does not have significant overactive bladder symptoms.   Vaginal atrophy: Patient does not like using the vaginal estrogen cream. She prefers to use something less messy and more user friendly. Will send in [place] and patient given a Good RX card to get a better price.   Selmer Dominion, NP

## 2023-02-16 ENCOUNTER — Encounter: Payer: Self-pay | Admitting: Obstetrics and Gynecology

## 2023-02-16 ENCOUNTER — Ambulatory Visit (INDEPENDENT_AMBULATORY_CARE_PROVIDER_SITE_OTHER): Payer: Medicare Other | Admitting: Obstetrics and Gynecology

## 2023-02-16 VITALS — BP 131/89 | HR 76

## 2023-02-16 DIAGNOSIS — N8110 Cystocele, unspecified: Secondary | ICD-10-CM

## 2023-02-16 DIAGNOSIS — N993 Prolapse of vaginal vault after hysterectomy: Secondary | ICD-10-CM

## 2023-02-16 DIAGNOSIS — N816 Rectocele: Secondary | ICD-10-CM

## 2023-02-16 NOTE — Progress Notes (Signed)
[facility]   Subjective:     Chief Complaint: pessary fitting  History of Present Illness: Brenda Short is a 67 y.o. female with stage II pelvic organ prolapse who presents today for a pessary fitting.    Past Medical History: Patient  has a past medical history of Allergy, Anxiety, Arthritis, Fibromyalgia, GERD (gastroesophageal reflux disease), Glenoid labral tear, Headache(784.0), Hypertension, MVP (mitral valve prolapse), and PONV (postoperative nausea and vomiting).   Past Surgical History: She  has a past surgical history that includes Vaginal hysterectomy; Hip arthroscopy; Tubal ligation; Breast surgery; Hip arthroscopy (04/20/2011); Total hip arthroplasty (11/09/2011); Joint replacement; Total shoulder arthroplasty (Right, 12/25/2018); Total shoulder arthroplasty (Right, 12/25/2018); and Total hip arthroplasty (Right, 05/16/2022).   Medications: She has a current medication list which includes the following prescription(s): acetaminophen, albuterol, amitriptyline, atenolol, cholecalciferol, docusate sodium, estradiol, gabapentin, omeprazole, paroxetine, pramipexole, prednisone, rizatriptan, telmisartan-hydrochlorothiazide, and trolamine salicylate.   Allergies: Patient has No Known Allergies.   Social History: Patient  reports that she has never smoked. She has never used smokeless tobacco. She reports current alcohol use of about 7.0 standard drinks of alcohol per week. She reports that she does not use drugs.      Objective:    BP 131/89 (BP Location: Left Arm, Patient Position: Sitting, Cuff Size: Small)   Pulse 76   SpO2 97%  Gen: No apparent distress, A&O x 3. Pelvic Exam: Normal external female genitalia; Bartholin's and Skene's glands normal in appearance; urethral meatus with caruncle, no urethral masses or discharge.   Attempted a #2 Marland which was too thick for patient.   A size #3 ring with support pessary was fitted. It was  comfortable, stayed in place with valsalva and was an appropriate size on examination, with one finger fitting between the pessary and the vaginal walls. The patient demonstrated proper removal and replacement.     Assessment/Plan:    Assessment: Ms. Clifft is a 67 y.o. with stage II pelvic organ prolapse who presents for a pessary fitting. Plan: She was fitted with a #3 ring with support pessary. She will keep the pessary in place until next visit. She will use estrogen.   Follow-up in 3 weeks for a pessary check or sooner as needed.  All questions were answered.    Selmer Dominion, NP

## 2023-02-16 NOTE — Patient Instructions (Signed)
Please take it out once a week. Clean with mild soap and water.   Leave it out overnight and replace during the morning time.   Continue to use the estrogen twice a week.   Let me know if you have any issues. If it is bothersome or you have trouble or feel uncomfortable.   Use a small amount of lubrication with insertion.

## 2023-03-01 ENCOUNTER — Encounter: Payer: Self-pay | Admitting: Obstetrics and Gynecology

## 2023-03-08 ENCOUNTER — Encounter: Payer: Self-pay | Admitting: Obstetrics and Gynecology

## 2023-03-09 ENCOUNTER — Ambulatory Visit: Payer: Medicare Other | Admitting: Obstetrics and Gynecology

## 2023-04-02 ENCOUNTER — Encounter: Payer: Self-pay | Admitting: Obstetrics and Gynecology

## 2023-04-03 MED ORDER — ESTRADIOL 10 MCG VA TABS: 10.0000 ug | ORAL_TABLET | VAGINAL | 3 refills | Status: AC

## 2023-05-18 ENCOUNTER — Encounter: Payer: Self-pay | Admitting: Obstetrics and Gynecology

## 2023-05-19 ENCOUNTER — Ambulatory Visit: Payer: Medicare Other | Admitting: Obstetrics and Gynecology

## 2023-05-21 ENCOUNTER — Other Ambulatory Visit: Payer: Self-pay

## 2023-05-21 ENCOUNTER — Emergency Department (HOSPITAL_COMMUNITY): Payer: Medicare Other

## 2023-05-21 ENCOUNTER — Emergency Department (HOSPITAL_COMMUNITY)
Admission: EM | Admit: 2023-05-21 | Discharge: 2023-05-21 | Disposition: A | Discharge status: Home / Self Care | Payer: Medicare Other | Attending: Emergency Medicine | Admitting: Emergency Medicine

## 2023-05-21 DIAGNOSIS — M25552 Pain in left hip: Secondary | ICD-10-CM | POA: Diagnosis present

## 2023-05-21 DIAGNOSIS — I10 Essential (primary) hypertension: Secondary | ICD-10-CM | POA: Insufficient documentation

## 2023-05-21 DIAGNOSIS — Z96642 Presence of left artificial hip joint: Secondary | ICD-10-CM | POA: Diagnosis not present

## 2023-05-21 DIAGNOSIS — S73005A Unspecified dislocation of left hip, initial encounter: Secondary | ICD-10-CM | POA: Insufficient documentation

## 2023-05-21 DIAGNOSIS — X501XXA Overexertion from prolonged static or awkward postures, initial encounter: Secondary | ICD-10-CM | POA: Insufficient documentation

## 2023-05-21 MED ORDER — HYDROMORPHONE HCL 1 MG/ML IJ SOLN
1.0000 mg | Freq: Once | INTRAMUSCULAR | Status: AC
  Administered 2023-05-21: 1 mg via INTRAVENOUS
  Filled 2023-05-21: qty 1

## 2023-05-21 MED ORDER — ONDANSETRON HCL 4 MG/2ML IJ SOLN
4.0000 mg | Freq: Once | INTRAMUSCULAR | Status: AC
  Administered 2023-05-21: 4 mg via INTRAVENOUS
  Filled 2023-05-21: qty 2

## 2023-05-21 MED ORDER — PROPOFOL 500 MG/50ML IV EMUL
0.5000 mg/kg | Freq: Once | INTRAVENOUS | Status: AC
  Administered 2023-05-21: 115 mg via INTRAVENOUS
  Filled 2023-05-21: qty 50

## 2023-05-21 MED ORDER — SODIUM CHLORIDE 0.9 % IV BOLUS
500.0000 mL | Freq: Once | INTRAVENOUS | Status: AC
  Administered 2023-05-21: 500 mL via INTRAVENOUS

## 2023-05-21 NOTE — ED Provider Notes (Signed)
[name] Provider Note   CSN: [id number] Arrival date & time: 05/21/23  1744     History  Chief Complaint  Patient presents with  . Hip Injury    Brenda Short is a 67 y.o. female.  Patient is a 67 year old female with a history of hypertension, prior left hip replacement who presents today with severe pain in her left hip after she was crouching down to get something out of her drawer and twisted and felt her hip pop out.  She is having severe pain in the left hip but denies any numbness or tingling down the leg.  She does not take any anticoagulation.  Denies any trauma.  The last time her hip came out was 3 years ago and she is otherwise been doing well.  The history is provided by the patient.       Home Medications Prior to Admission medications   Medication Sig Start Date End Date Taking? Authorizing Provider  acetaminophen (TYLENOL) 500 MG tablet Take 1,000 mg by mouth every 6 (six) hours as needed for moderate pain.    [provider]  albuterol (PROVENTIL HFA;VENTOLIN HFA) 108 (90 BASE) MCG/ACT inhaler Inhale 1-2 puffs into the lungs every 6 (six) hours as needed for wheezing or shortness of breath.    [provider]  amitriptyline (ELAVIL) 25 MG tablet Take 25 mg by mouth at bedtime.     [provider]  atenolol (TENORMIN) 25 MG tablet Take 25 mg by mouth every morning.      [provider]  cholecalciferol (VITAMIN D3) 25 MCG (1000 UNIT) tablet Take 1,000 Units by mouth daily.    [provider]  docusate sodium (COLACE) 100 MG capsule Take 100 mg by mouth daily as needed for moderate constipation.    [provider]  Estradiol (YUVAFEM) 10 MCG TABS vaginal tablet Place 1 tablet (10 mcg total) vaginally 2 (two) times a week. 04/03/23   Selmer Dominion, NP  gabapentin (NEURONTIN) 300 MG capsule Take 300-600 mg by mouth See admin instructions. 300 mg in the  morning, and 600 mg at bedtime 04/01/18   [provider]  omeprazole (PRILOSEC) 40 MG capsule Take 40 mg by mouth daily before breakfast.    [provider]  PARoxetine (PAXIL) 30 MG tablet Take 30 mg by mouth at bedtime.     [provider]  pramipexole (MIRAPEX) 1 MG tablet Take 2 mg by mouth at bedtime.  03/16/18   [provider]  predniSONE (DELTASONE) 20 MG tablet Take 20 mg by mouth 2 (two) times daily. 02/14/23   [provider]  rizatriptan (MAXALT) 10 MG tablet Take 10 mg by mouth daily as needed for migraine. 11/18/15   [provider]  telmisartan-hydrochlorothiazide (MICARDIS HCT) 80-25 MG tablet Take 1 tablet by mouth daily. 03/16/18   [provider]  trolamine salicylate (ASPERCREME) 10 % cream Apply 1 application topically as needed for muscle pain.    [provider]      Allergies    Patient has no known allergies.    Review of Systems   Review of Systems  Physical Exam Updated Vital Signs BP (!) 104/59 (BP Location: Left Arm)   Pulse 96   Temp 98.3 F (36.8 C) (Oral)   Resp 18   Ht 5\' 7"  (1.702 m)   Wt 75.8 kg   SpO2 97%   BMI 26.16 kg/m  Physical Exam Constitutional:  Appearance: Normal appearance.     Comments: Appears uncomfortable  Cardiovascular:     Rate and Rhythm: Normal rate.     Pulses: Normal pulses.  Musculoskeletal:        General: Tenderness present.     Left hip: Deformity and tenderness present. Decreased range of motion.  Skin:    General: Skin is warm.  Neurological:     Mental Status: She is alert.     Sensory: No sensory deficit.     Motor: No weakness.    ED Results / Procedures / Treatments   Labs (all labs ordered are listed, but only abnormal results are displayed) Labs Reviewed - No data to display  EKG None  Radiology DG HIP PORT UNILAT WITH PELVIS 1V LEFT Result Date: 05/21/2023 CLINICAL DATA:  Status post closed reduction of dislocated  left hip arthroplasty. EXAM: DG HIP (WITH OR WITHOUT PELVIS) 1V PORT LEFT COMPARISON:  Left hip radiographs 05/19/2023 FINDINGS: Single frontal view of the left hip. There is now appropriate overlap of the left acetabular cup and left femoral head prosthesis. No perihardware lucency is seen to indicate hardware failure or loosening. No acute fracture or dislocation. Minimal partial visualization of right acetabular cup. IMPRESSION: Interval closed reduction of left hip arthroplasty dislocation. Electronically Signed   By: Neita Garnet M.D.   On: 05/21/2023 19:57   DG Hip Unilat W or Wo Pelvis 2-3 Views Left Result Date: 05/21/2023 CLINICAL DATA:  Left hip pain EXAM: DG HIP (WITH OR WITHOUT PELVIS) 2-3V LEFT COMPARISON:  05/16/2022 FINDINGS: Bilateral hip arthroplasties. Posteroinferior dislocation of the left femoral head component out of the acetabular cup. Right hip prosthesis alignment appears normal. No periprosthetic fracture. IMPRESSION: Dislocated left hip prosthesis. Electronically Signed   By: Duanne Guess D.O.   On: 05/21/2023 19:00    Procedures .Reduction of dislocation  Date/Time: 05/21/2023 8:13 PM  Performed by: Gwyneth Sprout, MD Authorized by: Gwyneth Sprout, MD  Consent: Verbal consent obtained. Written consent obtained. Risks and benefits: risks, benefits and alternatives were discussed Consent given by: patient Patient understanding: patient states understanding of the procedure being performed Site marked: the operative site was marked Imaging studies: imaging studies available Patient identity confirmed: verbally with patient Time out: Immediately prior to procedure a "time out" was called to verify the correct patient, procedure, equipment, support staff and site/side marked as required. Preparation: Patient was prepped and draped in the usual sterile fashion. Local anesthesia used: no  Anesthesia: Local anesthesia used: no  Sedation: Patient sedated:  yes Sedation type: moderate (conscious) sedation Sedatives: propofol Analgesia: hydromorphone Sedation start date/time: 05/21/2023 7:30 AM Sedation end date/time: 05/21/2023 7:50 AM Vitals: Vital signs were monitored during sedation.  Patient tolerance: patient tolerated the procedure well with no immediate complications Comments: Reduction of left hip dislocation   .Sedation  Date/Time: 05/21/2023 7:30 PM  Performed by: Gwyneth Sprout, MD Authorized by: Gwyneth Sprout, MD   Consent:    Consent obtained:  Verbal   Consent given by:  Patient   Risks discussed:  Allergic reaction, dysrhythmia, inadequate sedation, nausea, prolonged hypoxia resulting in organ damage, prolonged sedation necessitating reversal, respiratory compromise necessitating ventilatory assistance and intubation and vomiting   Alternatives discussed:  Analgesia without sedation, anxiolysis and regional anesthesia Universal protocol:    Procedure explained and questions answered to patient or proxy's satisfaction: yes     Relevant documents present and verified: yes     Test results available: yes     Imaging studies available:  yes     Required blood products, implants, devices, and special equipment available: yes     Site/side marked: yes     Immediately prior to procedure, a time out was called: yes     Patient identity confirmed:  Verbally with patient Indications:    Procedure performed:  Dislocation reduction   Procedure necessitating sedation performed by:  Physician performing sedation Pre-sedation assessment:    Time since last food or drink:  5   ASA classification: class 1 - normal, healthy patient     Mouth opening:  3 or more finger widths   Thyromental distance:  4 finger widths   Mallampati score:  I - soft palate, uvula, fauces, pillars visible   Neck mobility: normal     Pre-sedation assessments completed and reviewed: airway patency, cardiovascular function, hydration status, mental  status, nausea/vomiting, pain level, respiratory function and temperature   A pre-sedation assessment was completed prior to the start of the procedure Immediate pre-procedure details:    Reassessment: Patient reassessed immediately prior to procedure     Reviewed: vital signs, relevant labs/tests and NPO status     Verified: bag valve mask available, emergency equipment available, intubation equipment available, IV patency confirmed, oxygen available and suction available   Procedure details (see MAR for exact dosages):    Preoxygenation:  Nasal cannula   Sedation:  Propofol   Intended level of sedation: deep   Intra-procedure monitoring:  Blood pressure monitoring, cardiac monitor, continuous pulse oximetry, frequent LOC assessments, frequent vital sign checks and continuous capnometry   Intra-procedure events: hypoxia     Intra-procedure management:  Airway repositioning   Total Provider sedation time (minutes):  20 Post-procedure details:   A post-sedation assessment was completed following the completion of the procedure.   Attendance: Constant attendance by certified staff until patient recovered     Recovery: Patient returned to pre-procedure baseline     Post-sedation assessments completed and reviewed: airway patency, cardiovascular function, hydration status, mental status, nausea/vomiting, pain level, respiratory function and temperature     Patient is stable for discharge or admission: yes     Procedure completion:  Tolerated well, no immediate complications Comments:     Pt tolerated well     Medications Ordered in ED Medications  HYDROmorphone (DILAUDID) injection 1 mg (1 mg Intravenous Given 05/21/23 1818)  ondansetron (ZOFRAN) injection 4 mg (4 mg Intravenous Given 05/21/23 1820)  propofol (DIPRIVAN) 500 MG/50ML infusion 37.9 mg (0 mg Intravenous Stopped 05/21/23 1938)  sodium chloride 0.9 % bolus 500 mL (500 mLs Intravenous New Bag/Given 05/21/23 1946)    ED Course/  Medical Decision Making/ A&P                                 Medical Decision Making Amount and/or Complexity of Data Reviewed Radiology: ordered and independent interpretation performed. Decision-making details documented in ED Course.  Risk Prescription drug management.   Patient presenting today with symptoms most classic for hip dislocation.  Patient given pain control, imaging pending.  8:20 PM I have independently visualized and interpreted pt's images today.  X-ray consistent with hip dislocation.  Discussed this with the patient and sedation for reduction and she is agreeable to this plan.  8:20 PM Hip was reduced as above.  Patient feels much better.  Knee immobilizer placed.  Repeat imaging showed satisfactory reduction.  Patient will follow-up with Dr. Despina Hick and wear knee immobilizer until  cleared          Final Clinical Impression(s) / ED Diagnoses Final diagnoses:  Dislocation of left hip, initial encounter [facility])    Rx / DC Orders ED Discharge Orders     None         Gwyneth Sprout, MD 05/21/23 2243

## 2023-05-21 NOTE — ED Triage Notes (Signed)
Patient brought in from home by EMS for L hip dislocation. Per patient she was home on knees bending getting something from under the bed, turned wrong and felt her hip move out of place. She had total hip replacement, 2 years ago same hip was dislocated. EMS started 22g in L wrist and gave Fentanyl.134/74 100 98%

## 2024-02-26 ENCOUNTER — Ambulatory Visit (INDEPENDENT_AMBULATORY_CARE_PROVIDER_SITE_OTHER)

## 2024-02-26 ENCOUNTER — Ambulatory Visit (INDEPENDENT_AMBULATORY_CARE_PROVIDER_SITE_OTHER): Admitting: Podiatry

## 2024-02-26 VITALS — Ht 67.0 in | Wt 167.0 lb

## 2024-02-26 DIAGNOSIS — M19071 Primary osteoarthritis, right ankle and foot: Secondary | ICD-10-CM | POA: Diagnosis not present

## 2024-02-26 DIAGNOSIS — M2142 Flat foot [pes planus] (acquired), left foot: Secondary | ICD-10-CM

## 2024-02-26 DIAGNOSIS — M7751 Other enthesopathy of right foot: Secondary | ICD-10-CM

## 2024-02-26 DIAGNOSIS — M2141 Flat foot [pes planus] (acquired), right foot: Secondary | ICD-10-CM | POA: Diagnosis not present

## 2024-02-26 DIAGNOSIS — M2012 Hallux valgus (acquired), left foot: Secondary | ICD-10-CM

## 2024-02-26 MED ORDER — BETAMETHASONE SOD PHOS & ACET 6 (3-3) MG/ML IJ SUSP
3.0000 mg | Freq: Once | INTRAMUSCULAR | Status: AC
  Administered 2024-02-26: 3 mg via INTRA_ARTICULAR

## 2024-02-26 NOTE — Patient Instructions (Signed)
 Orthotics   Patient was present and evaluated for Custom molded foot orthotics. Patient will benefit from CFO's to provide total contact to BIL MLA's helping to balance and distribute body weight more evenly across BIL feet helping to reduce plantar pressure and pain. Orthotic will also encourage FF / RF alignment  Patient was scanned today and will return for fitting upon receipt  [employer] onsurance not a covered benefit is making deposit of $150 towrds orthotics   Lolita Schultze Cped,

## 2024-02-26 NOTE — Progress Notes (Signed)
 Chief Complaint  Patient presents with   Foot Pain    Rm 6 Right foot pain, top of foot above arch, arch, great toe. Rheumatoid arthritis diagnosis.    HPI: 68 y.o. female PMHx rheumatoid arthritis, bilateral hip joint replacements, knee replacement, and shoulder surgery presenting today as a new patient for evaluation of right foot pain.  Chronic onset for several years.  Patient attributes her pain to the rheumatoid arthritis.  No recent history of injury.  Past Medical History:  Diagnosis Date   Allergy    Anxiety    Arthritis    Fibromyalgia    GERD (gastroesophageal reflux disease)    Glenoid labral tear    Headache(784.0)    TAKES ATENOLOL    Hypertension    MVP (mitral valve prolapse)    PONV (postoperative nausea and vomiting)    WITH DURAMORPH DRIP/ no problems 9'13 with hip scope    Past Surgical History:  Procedure Laterality Date   BREAST SURGERY     DUCTS REMOVED FROM RT BREAST 2008   HIP ARTHROSCOPY     2010 L HIP ARTHROSCOPY    HIP ARTHROSCOPY  04/20/2011   Procedure: ARTHROSCOPY HIP;  Surgeon: Dempsey GAILS Aluisio;  Location: [facility];  Service: Orthopedics;  Laterality: Left;  debridement labral tear, chondroplasty   JOINT REPLACEMENT     TOTAL HIP ARTHROPLASTY  11/09/2011   Procedure: TOTAL HIP ARTHROPLASTY;  Surgeon: Dempsey GAILS Moan, MD;  Location: [facility];  Service: Orthopedics;  Laterality: Left;   TOTAL HIP ARTHROPLASTY Right 05/16/2022   Procedure: TOTAL HIP ARTHROPLASTY ANTERIOR APPROACH;  Surgeon: Moan Dempsey, MD;  Location: [facility];  Service: Orthopedics;  Laterality: Right;   TOTAL SHOULDER ARTHROPLASTY Right 12/25/2018   TOTAL SHOULDER ARTHROPLASTY Right 12/25/2018   Procedure: RIGHT TOTAL SHOULDER ARTHROPLASTY;  Surgeon: Addie Cordella Hamilton, MD;  Location: [facility] OR;  Service: Orthopedics;  Laterality: Right;   TUBAL LIGATION     1986   VAGINAL HYSTERECTOMY      No Known Allergies   Physical Exam: General: The patient is alert and oriented x3 in  no acute distress.  Dermatology: Skin is warm, dry and supple bilateral lower extremities.   Vascular: Palpable pedal pulses bilaterally. Capillary refill within normal limits.  No appreciable edema.  No erythema.  Neurological: Grossly intact via light touch  Musculoskeletal Exam: Degenerative changes noted throughout the midfoot right.  Hallux valgus deformity noted left foot  Radiographic Exam B/L feet 02/26/2024:  Degenerative changes noted throughout the tarsometatarsal joint of the right foot.  Moderate hallux valgus deformity noted left  Assessment/Plan of Care: 1.  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  Hallux valgus left  -Patient evaluated.  X-rays reviewed -Injection of 0.5 cc Celestone  Soluspan injected around the medial aspect of the first TMT right since this area was most symptomatic -Today the patient was fitted for custom orthotics which I do believe should help support the medial longitudinal arch of the foot and potentially alleviate significant amount of the patient's rheumatoid arthritis -Continue management with rheumatology -Patient is leaving on a trip to [place] on 05/08/2024.  Return to clinic just before bed for reevaluation and possible additional injection       Thresa EMERSON Sar, DPM [facility]  Dr. Thresa EMERSON Sar, DPM    [address]  [place], [place] [address]                Office [phone]  Fax [phone]

## 2024-03-06 ENCOUNTER — Telehealth: Payer: Self-pay | Admitting: Podiatry

## 2024-03-06 ENCOUNTER — Encounter: Payer: Self-pay | Admitting: Podiatry

## 2024-03-06 NOTE — Telephone Encounter (Signed)
 Are the patient's inserts ready?

## 2024-03-06 NOTE — Telephone Encounter (Signed)
 Patient called and referred her to schedule appointment on Tricia's schedule.

## 2024-03-21 ENCOUNTER — Encounter

## 2024-03-25 ENCOUNTER — Encounter

## 2024-03-27 ENCOUNTER — Telehealth: Payer: Self-pay

## 2024-03-27 NOTE — Telephone Encounter (Signed)
 Orthotics are here [employer] (not a covered benefit) ABN signed and on file Appt needed for fitting/ pu

## 2024-03-29 NOTE — Telephone Encounter (Signed)
 LVM to schedule orthotic fitting/ pu

## 2024-04-11 ENCOUNTER — Ambulatory Visit

## 2024-04-11 NOTE — Progress Notes (Signed)
 Patient presents today to pick up custom molded foot orthotics, diagnosed with arthritis of mid-tarsal joint Right foot by Dr. Janit.   Orthotics were dispensed and fit was satisfactory. Reviewed instructions for break-in and wear. Written instructions given to patient.  Patient will follow up as needed. Brenda Short Cped, CFo, CFm

## 2024-04-29 ENCOUNTER — Ambulatory Visit: Admitting: Podiatry

## 2024-04-29 ENCOUNTER — Encounter: Payer: Self-pay | Admitting: Podiatry

## 2024-04-29 VITALS — Ht 67.0 in | Wt 167.0 lb

## 2024-04-29 DIAGNOSIS — M19071 Primary osteoarthritis, right ankle and foot: Secondary | ICD-10-CM

## 2024-04-29 MED ORDER — METHYLPREDNISOLONE 4 MG PO TBPK: ORAL_TABLET | ORAL | 0 refills | Status: AC

## 2024-04-29 NOTE — Progress Notes (Unsigned)
 Chief Complaint  Patient presents with   Foot Pain    Pt is here to f/u on right foot due to pain she states that she did not get much relief on the last visit, states the pain is at the top of the foot and the big toe joint.    HPI: 68 y.o. female PMHx rheumatoid arthritis, bilateral hip joint replacements, knee replacement, and shoulder surgery presenting today for follow-up evaluation of right foot pain.  Chronic onset for several years.  Patient attributes her pain to the rheumatoid arthritis.  No recent history of injury.  Past Medical History:  Diagnosis Date   Allergy    Anxiety    Arthritis    Fibromyalgia    GERD (gastroesophageal reflux disease)    Glenoid labral tear    Headache(784.0)    TAKES ATENOLOL    Hypertension    MVP (mitral valve prolapse)    PONV (postoperative nausea and vomiting)    WITH DURAMORPH DRIP/ no problems 9'13 with hip scope    Past Surgical History:  Procedure Laterality Date   BREAST SURGERY     DUCTS REMOVED FROM RT BREAST 2008   HIP ARTHROSCOPY     2010 L HIP ARTHROSCOPY    HIP ARTHROSCOPY  04/20/2011   Procedure: ARTHROSCOPY HIP;  Surgeon: Dempsey GAILS Aluisio;  Location: [facility];  Service: Orthopedics;  Laterality: Left;  debridement labral tear, chondroplasty   JOINT REPLACEMENT     TOTAL HIP ARTHROPLASTY  11/09/2011   Procedure: TOTAL HIP ARTHROPLASTY;  Surgeon: Dempsey GAILS Moan, MD;  Location: [facility];  Service: Orthopedics;  Laterality: Left;   TOTAL HIP ARTHROPLASTY Right 05/16/2022   Procedure: TOTAL HIP ARTHROPLASTY ANTERIOR APPROACH;  Surgeon: Moan Dempsey, MD;  Location: [facility];  Service: Orthopedics;  Laterality: Right;   TOTAL SHOULDER ARTHROPLASTY Right 12/25/2018   TOTAL SHOULDER ARTHROPLASTY Right 12/25/2018   Procedure: RIGHT TOTAL SHOULDER ARTHROPLASTY;  Surgeon: Addie Cordella Hamilton, MD;  Location[facility];  Service: Orthopedics;  Laterality: Right;   TUBAL LIGATION     1986   VAGINAL HYSTERECTOMY      No Known  Allergies   Physical Exam: General: The patient is alert and oriented x3 in no acute distress.  Dermatology: Skin is warm, dry and supple bilateral lower extremities.   Vascular: Palpable pedal pulses bilaterally. Capillary refill within normal limits.  No appreciable edema.  No erythema.  Neurological: Grossly intact via light touch  Musculoskeletal Exam: Degenerative changes noted throughout the midfoot right.  Hallux valgus deformity noted left foot  Radiographic Exam B/L feet 02/26/2024:  Degenerative changes noted throughout the tarsometatarsal joint of the right foot.  Moderate hallux valgus deformity noted left  Assessment/Plan of Care: 1.  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  Hallux valgus left  -Patient evaluated.  X-rays reviewed -Injection of 0.5 cc Celestone  Soluspan injected around the medial aspect of the first TMT right since this area was most symptomatic -Today the patient was fitted for custom orthotics which I do believe should help support the medial longitudinal arch of the foot and potentially alleviate significant amount of the patient's rheumatoid arthritis -Continue management with rheumatology -Patient is leaving on a trip to [place] on 05/08/2024.  Return to clinic just before bed for reevaluation and possible additional injection       Thresa EMERSON Sar, DPM [facility]  Dr. Thresa EMERSON Sar, DPM    [address]  [place], [place] [address]                Office [phone]  Fax [phone]

## 2024-04-30 DIAGNOSIS — M19071 Primary osteoarthritis, right ankle and foot: Secondary | ICD-10-CM | POA: Diagnosis not present

## 2024-04-30 MED ORDER — BETAMETHASONE SOD PHOS & ACET 6 (3-3) MG/ML IJ SUSP
3.0000 mg | Freq: Once | INTRAMUSCULAR | Status: AC
  Administered 2024-04-30: 3 mg via INTRA_ARTICULAR
